# Patient Record
Sex: Male | Born: 1955 | Race: White | Hispanic: No | Marital: Married | Smoking: Former smoker
Health system: Southern US, Community
[De-identification: ages and names within clinical notes are randomized; demographics above are authoritative.]

## PROBLEM LIST (undated history)

## (undated) DIAGNOSIS — G4730 Sleep apnea, unspecified: Secondary | ICD-10-CM

## (undated) DIAGNOSIS — H900 Conductive hearing loss, bilateral: Secondary | ICD-10-CM

## (undated) DIAGNOSIS — E781 Pure hyperglyceridemia: Principal | ICD-10-CM

## (undated) DIAGNOSIS — I10 Essential (primary) hypertension: Secondary | ICD-10-CM

## (undated) DIAGNOSIS — F419 Anxiety disorder, unspecified: Secondary | ICD-10-CM

## (undated) DIAGNOSIS — E559 Vitamin D deficiency, unspecified: Secondary | ICD-10-CM

## (undated) DIAGNOSIS — Z8739 Personal history of other diseases of the musculoskeletal system and connective tissue: Secondary | ICD-10-CM

## (undated) DIAGNOSIS — Z87442 Personal history of urinary calculi: Secondary | ICD-10-CM

## (undated) DIAGNOSIS — I2510 Atherosclerotic heart disease of native coronary artery without angina pectoris: Secondary | ICD-10-CM

## (undated) DIAGNOSIS — M1990 Unspecified osteoarthritis, unspecified site: Secondary | ICD-10-CM

## (undated) DIAGNOSIS — R079 Chest pain, unspecified: Secondary | ICD-10-CM

## (undated) HISTORY — DX: Unspecified osteoarthritis, unspecified site: M19.90

## (undated) HISTORY — PX: COLONOSCOPY: SHX5424

## (undated) HISTORY — PX: CARPAL TUNNEL RELEASE: SHX101

## (undated) HISTORY — DX: Vitamin D deficiency, unspecified: E55.9

## (undated) HISTORY — PX: OTHER SURGICAL HISTORY: SHX169

## (undated) HISTORY — DX: Sleep apnea, unspecified: G47.30

## (undated) HISTORY — DX: Pure hyperglyceridemia: E78.1

## (undated) HISTORY — DX: Personal history of other diseases of the musculoskeletal system and connective tissue: Z87.39

## (undated) HISTORY — DX: Essential (primary) hypertension: I10

## (undated) HISTORY — DX: Chest pain, unspecified: R07.9

## (undated) HISTORY — DX: Conductive hearing loss, bilateral: H90.0

---

## 2016-12-03 DIAGNOSIS — Z8739 Personal history of other diseases of the musculoskeletal system and connective tissue: Secondary | ICD-10-CM

## 2016-12-03 HISTORY — DX: Personal history of other diseases of the musculoskeletal system and connective tissue: Z87.39

## 2017-04-15 ENCOUNTER — Telehealth: Payer: Self-pay

## 2017-04-15 NOTE — Telephone Encounter (Signed)
Pre visit call completed 

## 2017-04-17 ENCOUNTER — Encounter: Payer: Self-pay | Admitting: Family Medicine

## 2017-04-17 ENCOUNTER — Ambulatory Visit (INDEPENDENT_AMBULATORY_CARE_PROVIDER_SITE_OTHER): Payer: BLUE CROSS/BLUE SHIELD | Admitting: Family Medicine

## 2017-04-17 VITALS — BP 104/76 | HR 86 | Temp 98.3°F | Ht 71.5 in | Wt 236.8 lb

## 2017-04-17 DIAGNOSIS — H9191 Unspecified hearing loss, right ear: Secondary | ICD-10-CM

## 2017-04-17 DIAGNOSIS — E785 Hyperlipidemia, unspecified: Secondary | ICD-10-CM

## 2017-04-17 DIAGNOSIS — M1A9XX0 Chronic gout, unspecified, without tophus (tophi): Secondary | ICD-10-CM | POA: Diagnosis not present

## 2017-04-17 DIAGNOSIS — E781 Pure hyperglyceridemia: Secondary | ICD-10-CM

## 2017-04-17 DIAGNOSIS — Z87442 Personal history of urinary calculi: Secondary | ICD-10-CM

## 2017-04-17 DIAGNOSIS — M25521 Pain in right elbow: Secondary | ICD-10-CM

## 2017-04-17 DIAGNOSIS — Z8673 Personal history of transient ischemic attack (TIA), and cerebral infarction without residual deficits: Secondary | ICD-10-CM | POA: Diagnosis not present

## 2017-04-17 HISTORY — DX: Hyperlipidemia, unspecified: E78.5

## 2017-04-17 HISTORY — DX: Pure hyperglyceridemia: E78.1

## 2017-04-17 LAB — COMPREHENSIVE METABOLIC PANEL
ALT: 20 U/L (ref 0–53)
AST: 18 U/L (ref 0–37)
Albumin: 4.4 g/dL (ref 3.5–5.2)
Alkaline Phosphatase: 40 U/L (ref 39–117)
BUN: 25 mg/dL — ABNORMAL HIGH (ref 6–23)
CHLORIDE: 104 meq/L (ref 96–112)
CO2: 26 meq/L (ref 19–32)
Calcium: 9.7 mg/dL (ref 8.4–10.5)
Creatinine, Ser: 1.17 mg/dL (ref 0.40–1.50)
GFR: 67.86 mL/min (ref >60.00)
GLUCOSE: 105 mg/dL — AB (ref 70–99)
POTASSIUM: 4.8 meq/L (ref 3.5–5.1)
Sodium: 139 mEq/L (ref 135–145)
Total Bilirubin: 0.4 mg/dL (ref 0.2–1.2)
Total Protein: 7.1 g/dL (ref 6.0–8.3)

## 2017-04-17 LAB — LIPID PANEL
CHOL/HDL RATIO: 4
Cholesterol: 135 mg/dL (ref 0–200)
HDL: 36.6 mg/dL — ABNORMAL LOW (ref >39.00)
Triglycerides: 441 mg/dL — ABNORMAL HIGH (ref 0.0–149.0)

## 2017-04-17 LAB — LDL CHOLESTEROL, DIRECT: Direct LDL: 52 mg/dL

## 2017-04-17 LAB — URIC ACID: URIC ACID, SERUM: 4.9 mg/dL (ref 4.0–7.8)

## 2017-04-17 NOTE — Patient Instructions (Signed)
If you do not hear anything about your referral in the nexts 1-2 weeks, call our office and ask for an update.  Give us 2-3 business days to get the results of your labs back. If labs are normal, you will likely receive a letter in the mail unless you have MyChart. This can take longer than 2-3 business days.   Let us know if you need anything.

## 2017-04-17 NOTE — Progress Notes (Signed)
Chief Complaint  Patient presents with  . Establish Care       New Patient Visit SUBJECTIVE: HPI: Daniel Waters is an 61 y.o.male who is being seen for establishing care.  Hx of TIA in 2010. On ASA and Lipitor. Tolerating well. Hx of hypertriglyceridemia, well controlled on fenofibrate.   Hx of kidney stones. He used to see a urologist in the area and wishes to re-establish with another one as he cannot find his previous provider. Hx of lithotripsy. No current s/s's.   Jan of this year, he fell of a back extension machine and hit his head. He was found to have an abnormal audiogram and was set to have surgery to repair 3 bones that were displaced in his ear on the R. He was relocated before this could be done. He is still having issues hearing. No pain or drainage from the ear.   Over several decades, he has had R elbow decreased ROM and pain posteriorly. 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 He denies any weakness, numbness, tingling or swelling. He saw an orthopod in [place] before leaving and was going to have a surgery that removes some bone to help with ROM and hopefully pain.   Allergies  Allergen Reactions  . Sulfur     Past Medical History:  Diagnosis Date  . Arthritis   . Asthma   . Hypertriglyceridemia 04/17/2017   Past Surgical History:  Procedure Laterality Date  . CARPAL TUNNEL RELEASE    . knee Bilateral    Social History   Social History  . Marital status: Married   Social History Main Topics  . Smoking status: Never Smoker  . Smokeless tobacco: Never Used  . Alcohol use Yes  . Drug use: No   Family History  Problem Relation Age of Onset  . Cancer Paternal Grandmother   . Stroke Paternal Grandfather      Current Outpatient Prescriptions:  .  allopurinol (ZYLOPRIM) 300 MG tablet, Take 300 mg by mouth., Disp: , Rfl:  .  aspirin 325 MG tablet, Take 325 mg by mouth daily., Disp: , Rfl:  .   atorvastatin (LIPITOR) 40 MG tablet, Take 40 mg by mouth., Disp: , Rfl:  .  Cyanocobalamin (VITAMIN B12 PO), Take by mouth., Disp: , Rfl:  .  fenofibrate (TRICOR) 145 MG tablet, Take 145 mg by mouth., Disp: , Rfl:   ROS Cardiovascular: Denies chest pain  Respiratory: Denies dyspnea   OBJECTIVE: BP 104/76 (BP Location: Left Arm, Patient Position: Sitting, Cuff Size: Normal)   Pulse 86   Temp 98.3 F (36.8 C) (Oral)   Ht 5' 11.5" (1.816 m)   Wt 236 lb 12.8 oz (107.4 kg)   SpO2 95%   BMI 32.57 kg/m   Constitutional: -  VS reviewed -  Well developed, well nourished, appears stated age -  No apparent distress  Psychiatric: -  Oriented to person, place, and time -  Memory intact -  Affect and mood normal -  Fluent conversation, good eye contact -  Judgment and insight age appropriate  Eye: -  Conjunctivae clear, no discharge -  Pupils symmetric, round, reactive to light  ENMT: -  MMM    Pharynx moist, no exudate, no erythema  Neck: -  No gross swelling, no palpable masses -  Thyroid midline, not enlarged, mobile, no palpable masses  Cardiovascular: -  RRR -  No LE edema  Respiratory: -  Normal respiratory effort, no accessory muscle use, no retraction -  Breath sounds equal, no wheezes, no ronchi, no crackles  Neurological:  -  CN II - XII grossly intact -  Sensation grossly intact to light touch, equal bilaterally  Musculoskeletal: -  No clubbing, no cyanosis -  Gait normal -  Very TTP over R posterior elbow, lateral olecranon process; I do not appreciate any deformity -  He is only able to extend his elbow to approx 150 degrees compared to 180 on L.  -  5/5 strength of the upper extremities bilaterally   Skin: -  No significant lesion on inspection -  Warm and dry to palpation   ASSESSMENT/PLAN: Hypertriglyceridemia - Plan: Comprehensive metabolic panel, Lipid panel  History of kidney stones - Plan: Ambulatory referral to Urology  Chronic gout involving toe of right  foot without tophus, unspecified cause - Plan: Uric acid  Hearing loss of right ear, unspecified hearing loss type - Plan: Ambulatory referral to ENT  Right elbow pain - Plan: Ambulatory referral to Orthopedic Surgery  History of TIA (transient ischemic attack)  Patient instructed to sign release of records form from his previous PCP. OK to stop ACEi given good BP. Check uric acid, may be OK to decrease allopurinol.  Patient should return in 6 mo for a med check pending above. The patient voiced understanding and agreement to the plan.   Jilda Rocheicholas Paul [place], DO 04/17/17  10:46 AM

## 2017-04-18 ENCOUNTER — Other Ambulatory Visit: Payer: Self-pay | Admitting: Family Medicine

## 2017-04-18 ENCOUNTER — Encounter: Payer: Self-pay | Admitting: Family Medicine

## 2017-04-18 DIAGNOSIS — M1A9XX1 Chronic gout, unspecified, with tophus (tophi): Secondary | ICD-10-CM

## 2017-04-18 DIAGNOSIS — E781 Pure hyperglyceridemia: Secondary | ICD-10-CM

## 2017-04-18 MED ORDER — ALLOPURINOL 100 MG PO TABS: 100.0000 mg | ORAL_TABLET | Freq: Every day | ORAL | 6 refills | Status: DC

## 2017-04-18 MED ORDER — ICOSAPENT ETHYL 1 G PO CAPS: ORAL_CAPSULE | ORAL | 5 refills | Status: DC

## 2017-04-18 NOTE — Progress Notes (Signed)
Ordered Vascepa for TG's and future lipid panel. Also ordered lower dose of allopurinol and uric acid lab for gout. Pt notified via MyChart. We are to reach out to him to schedule lab visit.

## 2017-05-01 DIAGNOSIS — H900 Conductive hearing loss, bilateral: Secondary | ICD-10-CM | POA: Insufficient documentation

## 2017-05-01 HISTORY — DX: Conductive hearing loss, bilateral: H90.0

## 2017-05-02 ENCOUNTER — Ambulatory Visit (INDEPENDENT_AMBULATORY_CARE_PROVIDER_SITE_OTHER): Payer: BLUE CROSS/BLUE SHIELD

## 2017-05-02 ENCOUNTER — Encounter (INDEPENDENT_AMBULATORY_CARE_PROVIDER_SITE_OTHER): Payer: Self-pay | Admitting: Orthopedic Surgery

## 2017-05-02 ENCOUNTER — Ambulatory Visit (INDEPENDENT_AMBULATORY_CARE_PROVIDER_SITE_OTHER): Payer: BLUE CROSS/BLUE SHIELD | Admitting: Orthopedic Surgery

## 2017-05-02 DIAGNOSIS — M25522 Pain in left elbow: Secondary | ICD-10-CM | POA: Diagnosis not present

## 2017-05-02 MED ORDER — IBUPROFEN-FAMOTIDINE 800-26.6 MG PO TABS: 1.0000 | ORAL_TABLET | Freq: Two times a day (BID) | ORAL | 0 refills | Status: DC | PRN

## 2017-05-02 NOTE — Progress Notes (Signed)
Office Visit Note   Patient: Daniel Waters           Date of Birth: 02-04-1956           MRN: [id number] Visit Date: 05/02/2017 Requested by: Daniel Dory, DO [address] [place], [place] [address] PCP: Daniel Dory, DO  Subjective: Chief Complaint  Patient presents with  . Right Elbow - Pain    HPI: Daniel Waters is a 61 year old right-hand-dominant patient with right elbow pain.  He describes chronic pain.  He wonders if he had an injury from playing many years of baseball.  He is right-hand-dominant.  He recently transferred here from [place].  He is "ready to have his arm fixed".  He is unable to fully extend the arm.  He does from sleep at night with pain and also describes decreased strength in his arm.  He tried over-the-counter medications and injections without relief.  He was seeing a physician in [place] who eventually is going to perform surgery on the elbow.  He does have a history of gout and he is on allopurinol.  He works as an Art gallery manager.              ROS: All systems reviewed are negative as they relate to the chief complaint within the history of present illness.  Patient denies  fevers or chills.   Assessment & Plan: Visit Diagnoses:  1. 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  Plan is observation.  I am get a put him on Duexis.  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  Surgery to address this would be unpredictable at best in terms of relieving any modicum of his pain.  He doesn't have any loose bodies on plain radiographs nor does he have mechanical symptoms.  I think when the time comes he may benefit from capsular release.  Currently he has a flexion contracture of about 25.  I don't think operative indications exist in the right elbow at this time.  It would be difficult to improve his range of motion to an  appreciable degree and to improve his strength with the surgery at this time.  This is a difficult situation for Daniel Waters.  Arthroscopic intervention for any type of spur removal has a potential to worsen his range of motion.  I will see him back when the flexion contracture in his elbow progresses to the point of 35-40 at which time a capsular release could be considered to gain back 20 of motion.  I did explain to him typically that capsular release surgery can restore about half of the lost range of motion.  Follow-Up Instructions: Return if symptoms worsen or fail to improve.   Orders:  Orders Placed This Encounter  Procedures  . XR Elbow Complete Right (3+View)   Meds ordered this encounter  Medications  . Ibuprofen-Famotidine (DUEXIS) 800-26.6 MG TABS    Sig: Take 1 tablet by mouth 2 (two) times daily as needed.    Dispense:  90 tablet    Refill:  0      Procedures: No procedures performed   Clinical Data: No additional findings.  Objective: Vital Signs: There were no vitals taken for this visit.  Physical Exam:   Constitutional: Patient appears well-developed HEENT:  Head: Normocephalic Eyes:EOM are normal Neck: Normal range of motion Cardiovascular: Normal rate  Pulmonary/chest: Effort normal Neurologic: Patient is alert Skin: Skin is warm Psychiatric: Patient has normal mood and affect    Ortho Exam: Right elbow demonstrates range of motion from 25 to full flexion.  Patient has full pronation supination.  There is some coarseness with range of motion.  Fairly symmetric grip strength bilaterally with intact EPL FPL interosseous function.  Negative Tinel's in the cubital tunnel in the elbow.  Specialty Comments:  No specialty comments available.  Imaging: Xr Elbow Complete Right (3+view)  Result Date: 05/02/2017 AP lateral oblique right elbow reviewed.  Severe osteoarthritis is present.  Spurring is noted on the radial head and coronoid process.  Significant  joint space narrowing throughout the ulnohumeral articulation is noted.  No large calcified loose bodies visible in the anterior or posterior elbow compartments    PMFS History: Patient Active Problem List   Diagnosis Date Noted  . Hypertriglyceridemia 04/17/2017   Past Medical History:  Diagnosis Date  . Arthritis   . Asthma   . Hypertriglyceridemia 04/17/2017    Family History  Problem Relation Age of Onset  . Cancer Paternal Grandmother   . Stroke Paternal Grandfather     Past Surgical History:  Procedure Laterality Date  . CARPAL TUNNEL RELEASE    . knee Bilateral    Social History   Occupational History  . Not on file.   Social History Main Topics  . Smoking status: Never Smoker  . Smokeless tobacco: Never Used  . Alcohol use Yes  . Drug use: No  . Sexual activity: Not on file

## 2017-05-14 ENCOUNTER — Telehealth (INDEPENDENT_AMBULATORY_CARE_PROVIDER_SITE_OTHER): Payer: Self-pay | Admitting: Orthopedic Surgery

## 2017-05-14 NOTE — Telephone Encounter (Signed)
IC information provided

## 2017-05-14 NOTE — Telephone Encounter (Signed)
Tresa Endo from [employer] called needing clinicals on the patient's prescription.  CB#[phone].  Thank you.

## 2017-05-15 ENCOUNTER — Telehealth: Payer: Self-pay | Admitting: Family Medicine

## 2017-05-15 NOTE — Telephone Encounter (Signed)
Patient Name: Daniel Waters  DOB: 04/17/1956    Initial Comment Caller states husband having chest pain and some tingling in chest area   Nurse Assessment  Nurse: Stefano Gaul, RN, Dwana Curd Date/Time Lamount Cohen Time): 05/15/2017 2:04:01 PM  Confirm and document reason for call. If symptomatic, describe symptoms. ---Caller states spouse is having intermittent chest pain. BP 150 and is now 130. Says it is tingling. He is at work. She is not sure how long the chest pain lasts.  Does the patient have any new or worsening symptoms? ---Yes  Will a triage be completed? ---No  Select reason for no triage. ---Other  Please document clinical information provided and list any resource used. ---Verified that secondary number is his number. Spouse will text pt that I am calling him so he will answer the phone.    Nurse: Stefano Gaul, RN, Vera Date/Time Lamount Cohen Time): 05/15/2017 2:32:04 PM  Confirm and document reason for call. If symptomatic, describe symptoms. ---Caller states he went home from lunch. Had pain from his left arm to his chest. BP 130 earlier. Pain only last a few seconds. And has happened 2-3 times today. No pain now.   Does the patient have any new or worsening symptoms? ---Yes   Will a triage be completed? ---Yes   Related visit to physician within the last 2 weeks? ---No   Does the PT have any chronic conditions? (i.e. diabetes, asthma, etc.) ---Yes   List chronic conditions. ---TIA; HTN   Is this a behavioral health or substance abuse call? ---No      Guidelines    Guideline Title Affirmed Question Affirmed Notes  Chest Pain Pain also present in shoulder(s) or arm(s) or jaw (Exception: pain is clearly made worse by movement)    Final Disposition User   Go to ED Now Stefano Gaul, RN, Dwana Curd    Comments  attempted to call pt and left message. Will call pt again in a few min  attempted to call spouse back and left message.  pt states he feels fine now and does not want to go to the ER.  Called  office and spoke to [facility] and gave report that pt has had chest pain with left arm 2-3 times today. Pain last a few seconds. No pain now . Triage outcome of go to ER now but pt does not want to go but wants to make appt   Referrals  GO TO FACILITY REFUSED   Caller Disagree/Comply Disagree  Caller Understands Yes  PreDisposition Call Doctor

## 2017-05-16 ENCOUNTER — Ambulatory Visit: Payer: BLUE CROSS/BLUE SHIELD | Admitting: Family Medicine

## 2017-05-16 NOTE — Telephone Encounter (Signed)
It might be a reasonable idea to have him come in to make sure it isn't typical chest pain. If he is strongly against it, make sure he lets [place] know or goes to ED should it return. TY.

## 2017-05-16 NOTE — Telephone Encounter (Signed)
Called the patient and he scheduled an appt in the morning at 7 am/.

## 2017-05-16 NOTE — Telephone Encounter (Signed)
Follow up call made to patient. WIfe states they received call from Triage nurse who stated she would forward information to Dr. Carmelia Roller. Wife states patient is currently not having pain or symptoms at this time. Advised to take patient directly to ED if pains return. Wife agreed.

## 2017-05-17 ENCOUNTER — Encounter: Payer: Self-pay | Admitting: Family Medicine

## 2017-05-17 ENCOUNTER — Ambulatory Visit (INDEPENDENT_AMBULATORY_CARE_PROVIDER_SITE_OTHER): Payer: BLUE CROSS/BLUE SHIELD | Admitting: Family Medicine

## 2017-05-17 VITALS — BP 120/82 | HR 68 | Temp 98.5°F | Ht 73.0 in | Wt 234.2 lb

## 2017-05-17 DIAGNOSIS — R079 Chest pain, unspecified: Secondary | ICD-10-CM

## 2017-05-17 DIAGNOSIS — R0789 Other chest pain: Secondary | ICD-10-CM

## 2017-05-17 NOTE — Progress Notes (Signed)
Pre visit review using our clinic review tool, if applicable. No additional management support is needed unless otherwise documented below in the visit note. 

## 2017-05-17 NOTE — Patient Instructions (Addendum)
If you do not hear anything about your cardiology referral in the next wek call our office and ask for an update.  Continue aspirin for now.  Things I would look out for: Chest pressure that comes with physical exertion, typically centrally located and lasting 10-15 minutes, resolving with rest.

## 2017-05-17 NOTE — Progress Notes (Signed)
Chief Complaint  Patient presents with  . Chest Pain    Daniel Waters is a 61 y.o. male here for evaluation of chest pain.  Duration of issue: 3 days Quality: sharp twitch Palliation: none Provocation: none really- has an active job, will sometimes get twitches after strenuous days Severity: 5/10 Radiation: L arm Duration of chest pain: "split second" Associated symptoms: none Cardiac history: Hypertriglyceridemia Family heart history: Father had MI at 74, no known heart problems with him prior or in other fam members Smoker? No  He is currently taking a full ASA and lipitor daily.   ROS:  Cardiac: No current chest pain Lungs: No SOB  Past Medical History:  Diagnosis Date  . Arthritis   . Asthma   . Hypertriglyceridemia 04/17/2017   Family History  Problem Relation Age of Onset  . Heart attack Father 73       No known heart disease  . Cancer Paternal Grandmother   . Stroke Paternal Grandfather    Social History   Social History  . Marital status: Married   Social History Main Topics  . Smoking status: Never Smoker  . Smokeless tobacco: Never Used  . Alcohol use Yes  . Drug use: No   Allergies as of 05/17/2017      Reactions   Sulfur       Medication List       Accurate as of 05/17/17  7:52 AM. Always use your most recent med list.          allopurinol 100 MG tablet Commonly known as:  ZYLOPRIM Take 1 tablet (100 mg total) by mouth daily.   aspirin 325 MG tablet Take 325 mg by mouth daily.   atorvastatin 40 MG tablet Commonly known as:  LIPITOR Take 40 mg by mouth.   fenofibrate 145 MG tablet Commonly known as:  TRICOR Take 145 mg by mouth.   Ibuprofen-Famotidine 800-26.6 MG Tabs Commonly known as:  DUEXIS Take 1 tablet by mouth 2 (two) times daily as needed.   Icosapent Ethyl 1 g Caps Take 2 tabs twice daily.   VITAMIN B12 PO Take by mouth.      BP 120/82 (BP Location: Left Arm, Patient Position: Sitting, Cuff Size: Large)    Pulse 68   Temp 98.5 F (36.9 C) (Oral)   Ht  (1.854 m)   Wt 234 lb 4 oz (106.3 kg)   SpO2 95%   BMI 30.91 kg/m  Gen: awake, alert, appears stated age HEENT: PERRLA, MMM Neck: No masses or asymmetry Heart: RRR, no bruits, no LE edema Lungs: CTAB, no accessory muscle use Abd: Soft, NT, ND, no masses or organomegaly MSK: chest pain is not reproducible to palptation Psych: Age appropriate judgment and insight, nml mood and affect  Atypical chest pain - Plan: Ambulatory referral to Cardiology  Chest pain, unspecified type - Plan: EKG 12-Lead   EKG shows nonspecific T wave changes. No ST seg elevation/depression. Will refer to cardiology for reassurance and nonspecific EKG. His hx is more suggestive of msk etiology. Informed patient that cardiology team may do stress test or deem that this is certainly not cardiac related.  F/u prn. The patient voiced understanding and agreement to the plan.  Jilda Roche [place], DO 05/17/17 7:52 AM

## 2017-05-20 ENCOUNTER — Encounter: Payer: Self-pay | Admitting: *Deleted

## 2017-05-20 DIAGNOSIS — G4730 Sleep apnea, unspecified: Secondary | ICD-10-CM | POA: Insufficient documentation

## 2017-05-20 DIAGNOSIS — I10 Essential (primary) hypertension: Secondary | ICD-10-CM

## 2017-05-20 DIAGNOSIS — R079 Chest pain, unspecified: Secondary | ICD-10-CM

## 2017-05-20 DIAGNOSIS — E559 Vitamin D deficiency, unspecified: Secondary | ICD-10-CM | POA: Insufficient documentation

## 2017-05-20 HISTORY — DX: Sleep apnea, unspecified: G47.30

## 2017-05-20 HISTORY — DX: Essential (primary) hypertension: I10

## 2017-05-20 HISTORY — DX: Chest pain, unspecified: R07.9

## 2017-05-20 HISTORY — DX: Vitamin D deficiency, unspecified: E55.9

## 2017-05-21 NOTE — Progress Notes (Signed)
Cardiology Office Note:    Date:  05/22/2017   ID:  Daniel Waters, DOB 03/24/1956, MRN [id number]  PCP:  Sharlene Dory, DO  Cardiologist:  Norman Herrlich, MD   Referring MD: Sharlene Dory*  ASSESSMENT:    1. Chest pain in adult   2. Palpitation   3. Hypertriglyceridemia   4. Essential hypertension    PLAN:    In order of problems listed above:  1. His chest pain is quite atypical non anginal but EKG is abnormal and myocardial perfusion study is ordered.if normal I would pursue no further cardiac evaluation 2. At risk for atrial fibrillation with previous TIA symptoms are very infrequent and I asked him to get an Iphone adapter for recording heart rhythm. 3. Stable continue current treatment 4. Stable continue ACE inhibitor   Next appointment 4 weeks   Medication Adjustments/Labs and Tests Ordered: Current medicines are reviewed at length with the patient today.  Concerns regarding medicines are outlined above.  Orders Placed This Encounter  Procedures  . Myocardial Perfusion Imaging   No orders of the defined types were placed in this encounter.    Chief Complaint  Patient presents with  . New Patient (Initial Visit)    Pain in Lt arm 2 times over 2 weeks/ chest pains on and off    History of Present Illness:    Daniel Waters is a 61 y.o. male who is being seen today for the evaluation of chest pain at the request of Sharlene Dory*. He has a history of TIA with aphasiaand 2009 and is taken and has taken ASA since that time.he has intermittent palpitation but no documentation of arrhythmia. Recently he's had a brief momentary twitching in the left arm and left chest nonexertional in nature.he has been under intense work stress with relocation. He's return to his exercise program and has had no exertional chest pains hortness of breath syncope or TIA.he has had no recent palpitation   Past Medical History:  Diagnosis Date  .  Arthritis   . Asthma   . Benign essential HTN 05/20/2017  . Chest pain 05/20/2017  . Conductive hearing loss of both ears 05/01/2017  . History of gout 12/03/2016  . Hypertriglyceridemia 04/17/2017  . Sleep apnea 05/20/2017  . Vitamin D deficiency 05/20/2017    Past Surgical History:  Procedure Laterality Date  . CARPAL TUNNEL RELEASE    . knee Bilateral     Current Medications: Current Meds  Medication Sig  . allopurinol (ZYLOPRIM) 100 MG tablet Take 1 tablet (100 mg total) by mouth daily.  Marland Kitchen aspirin 325 MG tablet Take 325 mg by mouth daily.  Marland Kitchen atorvastatin (LIPITOR) 40 MG tablet Take 40 mg by mouth.  . Cyanocobalamin (VITAMIN B12 PO) Take by mouth.  . DUEXIS 800-26.6 MG TABS Take 1 tablet by mouth 2 (two) times daily as needed.  . fenofibrate (TRICOR) 145 MG tablet Take 145 mg by mouth.  Marland Kitchen lisinopril (PRINIVIL,ZESTRIL) 5 MG tablet Take 5 mg by mouth daily.     Allergies:   Sulfur   Social History   Social History  . Marital status: Married    Spouse name: N/A  . Number of children: N/A  . Years of education: N/A   Social History Main Topics  . Smoking status: Never Smoker  . Smokeless tobacco: Never Used  . Alcohol use Yes  . Drug use: No  . Sexual activity: Not Asked   Other Topics Concern  . None  Social History Narrative  . None     Family History: The patient's family history includes Cancer in his paternal grandmother; Heart attack (age of onset: 31) in his father; Stroke in his paternal grandfather.  ROS:   ROS Please see the history of present illness.     All other systems reviewed and are negative.  EKGs/Labs/Other Studies Reviewed:    The following studies were reviewed today:   EKG 05/17/17: SRTH, diffuse T wave inversion, consider ischemia Recent Labs: 04/17/2017: ALT 20; BUN 25; Creatinine, Ser 1.17; Potassium 4.8; Sodium 139  Recent Lipid Panel    Component Value Date/Time   CHOL 135 04/17/2017 0818   TRIG (H) 04/17/2017 0818     441.0 Triglyceride is over 400; calculations on Lipids are invalid.   HDL 36.60 (L) 04/17/2017 0818   CHOLHDL 4 04/17/2017 0818   LDLDIRECT 52.0 04/17/2017 0818    Physical Exam:    VS:  BP 118/86 (BP Location: Right Arm, Patient Position: Sitting, Cuff Size: Normal)   Pulse 68   Ht  (1.854 m)   Wt 236 lb (107 kg)   SpO2 97%   BMI 31.14 kg/m     Wt Readings from Last 3 Encounters:  05/22/17 236 lb (107 kg)  05/17/17 234 lb 4 oz (106.3 kg)  04/17/17 236 lb 12.8 oz (107.4 kg)     GEN:  Well nourished, well developed in no acute distress HEENT: Normal NECK: No JVD; No carotid bruits LYMPHATICS: No lymphadenopathy CARDIAC: RRR, no murmurs, rubs, gallops RESPIRATORY:  Clear to auscultation without rales, wheezing or rhonchi  ABDOMEN: Soft, non-tender, non-distended MUSCULOSKELETAL:  No edema; No deformity  SKIN: Warm and dry NEUROLOGIC:  Alert and oriented x 3 PSYCHIATRIC:  Normal affect     Signed, Norman Herrlich, MD  05/22/2017 1:22 PM    [facility]

## 2017-05-22 ENCOUNTER — Ambulatory Visit (INDEPENDENT_AMBULATORY_CARE_PROVIDER_SITE_OTHER): Payer: BLUE CROSS/BLUE SHIELD | Admitting: Cardiology

## 2017-05-22 ENCOUNTER — Encounter: Payer: Self-pay | Admitting: Cardiology

## 2017-05-22 VITALS — BP 118/86 | HR 68 | Ht 73.0 in | Wt 236.0 lb

## 2017-05-22 DIAGNOSIS — R079 Chest pain, unspecified: Secondary | ICD-10-CM

## 2017-05-22 DIAGNOSIS — I10 Essential (primary) hypertension: Secondary | ICD-10-CM

## 2017-05-22 DIAGNOSIS — R002 Palpitations: Secondary | ICD-10-CM | POA: Diagnosis not present

## 2017-05-22 DIAGNOSIS — E781 Pure hyperglyceridemia: Secondary | ICD-10-CM

## 2017-05-22 HISTORY — DX: Palpitations: R00.2

## 2017-05-22 NOTE — Patient Instructions (Addendum)
Medication Instructions:  Your physician recommends that you continue on your current medications as directed. Please refer to the Current Medication list given to you today.  Labwork: None  Testing/Procedures: Your physician has requested that you have en exercise stress myoview. For further information please visit [email or web address]. Please follow instruction sheet, as given.  Follow-Up: Your physician recommends that you schedule a follow-up appointment in: 4 weeks.  Any Other Special Instructions Will Be Listed Below (If Applicable).     If you need a refill on your cardiac medications before your next appointment, please call your pharmacy.    KardiaMobile [email or web address]        FDA-cleared, clinical grade mobile EKG monitor: Lourena SimmondsKardia is the most clinically-validated mobile EKG used by the world's leading cardiac care medical professionals With Basic service, know instantly if your heart rhythm is normal or if atrial fibrillation is detected, and email the last single EKG recording to yourself or your doctor Premium service, available for purchase through the Kardia app for $9.99 per month or $99 per year, includes unlimited history and storage of your EKG recordings, a monthly EKG summary report to share with your doctor, along with the ability to track your blood pressure, activity and weight Includes one KardiaMobile phone clip FREE SHIPPING: Standard delivery 1-3 business days. Orders placed by 11:00am PST will ship that afternoon. Otherwise, will ship next business day. All orders ship via [employer] Priority Mail from [place]

## 2017-05-28 ENCOUNTER — Telehealth (HOSPITAL_COMMUNITY): Payer: Self-pay | Admitting: *Deleted

## 2017-05-28 NOTE — Telephone Encounter (Signed)
Patient given detailed instructions per Myocardial Perfusion Study Information Sheet for the test on 05/31/17. Patient notified to arrive 15 minutes early and that it is imperative to arrive on time for appointment to keep from having the test rescheduled.  If you need to cancel or reschedule your appointment, please call the office within 24 hours of your appointment. . Patient verbalized understanding. Cadell Gabrielson Jacqueline    

## 2017-05-31 ENCOUNTER — Ambulatory Visit (HOSPITAL_COMMUNITY): Payer: BLUE CROSS/BLUE SHIELD | Attending: Cardiovascular Disease

## 2017-05-31 DIAGNOSIS — R079 Chest pain, unspecified: Secondary | ICD-10-CM | POA: Diagnosis not present

## 2017-05-31 DIAGNOSIS — R9439 Abnormal result of other cardiovascular function study: Secondary | ICD-10-CM | POA: Diagnosis not present

## 2017-05-31 DIAGNOSIS — I259 Chronic ischemic heart disease, unspecified: Secondary | ICD-10-CM | POA: Diagnosis not present

## 2017-05-31 LAB — MYOCARDIAL PERFUSION IMAGING
CHL CUP MPHR: 162 {beats}/min
CHL CUP NUCLEAR SDS: 4
CHL CUP NUCLEAR SSS: 4
CHL CUP RESTING HR STRESS: 89 {beats}/min
CSEPED: 4 min
CSEPEW: 6.1 METS
CSEPHR: 95 %
Exercise duration (sec): 15 s
LHR: 0.33
LV dias vol: 137 mL (ref 62–150)
LV sys vol: 71 mL
Peak HR: 155 {beats}/min
SRS: 0
TID: 1.25

## 2017-05-31 MED ORDER — TECHNETIUM TC 99M TETROFOSMIN IV KIT
10.5000 | PACK | Freq: Once | INTRAVENOUS | Status: AC | PRN
  Administered 2017-05-31: 10.5 via INTRAVENOUS
  Filled 2017-05-31: qty 11

## 2017-05-31 MED ORDER — TECHNETIUM TC 99M TETROFOSMIN IV KIT
30.8000 | PACK | Freq: Once | INTRAVENOUS | Status: AC | PRN
  Administered 2017-05-31: 30.8 via INTRAVENOUS
  Filled 2017-05-31: qty 31

## 2017-06-04 NOTE — H&P (View-Only) (Signed)
Cardiology Office Note:    Date:  06/05/2017   ID:  Daniel Waters, DOB 02/01/1956, MRN [id number]  PCP:  Daniel Dory, DO  Cardiologist:  Daniel Herrlich, MD    Referring MD: Daniel Waters*    ASSESSMENT:    1. Abnormal myocardial perfusion study   2. Abnormal exercise myocardial perfusion study    PLAN:    In order of problems listed above:  1. His myocardial perfusion study shows a limited perfusion defect but a markedly abnormal EKG prompting discontinuation of his stress test and abnormal blood pressure response and reduced ejection fraction.  He will continue medical treatment including aspirin statin initiate beta-blocker and undergo further evaluation.  His apprehension is particularly acute regarding coronary angiography cardiac CTA with flow reserve will be employed.  He has high risk markers engaged him in a discussion vascularization.  At present he is having no anginal discomfort.  After further discussion of option benefit and risk he has decided to proceed with left heart catheterization.  Today 06/19/2017 Dr. Myna Waters came to my office and told me is in opinion there is no underlying hematologic problem and strongly encouraged me to proceed with needed coronary angiography.  His consultation is in the chart the patient is agreeable understands options risk and benefits and will be scheduled in the next 2 weeks to undergo coronary angiography.  Next appointment: 4 weeks   Medication Adjustments/Labs and Tests Ordered: Current medicines are reviewed at length with the patient today.  Concerns regarding medicines are outlined above.  Orders Placed This Encounter  Procedures  . CT CORONARY MORPH W/CTA COR W/SCORE W/CA W/CM &/OR WO/CM  . CT CORONARY FRACTIONAL FLOW RESERVE DATA PREP  . CT CORONARY FRACTIONAL FLOW RESERVE FLUID ANALYSIS  . CT CORONARY FRACTIONAL FLOW RESERVE FLUID ANALYSIS   Meds ordered this encounter  Medications  . metoprolol  tartrate (LOPRESSOR) 25 MG tablet    Sig: Take 1 tablet (25 mg total) by mouth 2 (two) times daily.    Dispense:  60 tablet    Refill:  3  . nitroGLYCERIN (NITROSTAT) 0.4 MG SL tablet    Sig: Place 1 tablet (0.4 mg total) under the tongue every 5 (five) minutes as needed for chest pain.    Dispense:  25 tablet    Refill:  12    Chief Complaint  Patient presents with  . Follow-up    Testing    History of Present Illness:    Daniel Waters is a 61 y.o. male with a hx of chest pain last seen 4 weeks ago.  He is quite emotionally distressed with an abnormal myocardial perfusion study.  He is not having angina is exercising 30 minutes a day.  I did ask him to start a beta-blocker gave him a prescription for nitroglycerin and advised further evaluation.  After a long discussion with the assistance of his wife she prefers cardiac CTA with fractional flow reserve over invasive coronary arteriography to assess the severity of his CAD guide treatment beyond medication.. Compliance with diet, lifestyle and medications: Yes Past Medical History:  Diagnosis Date  . Arthritis   . Asthma   . Benign essential HTN 05/20/2017  . Chest pain 05/20/2017  . Conductive hearing loss of both ears 05/01/2017  . History of gout 12/03/2016  . Hypertriglyceridemia 04/17/2017  . Sleep apnea 05/20/2017  . Vitamin D deficiency 05/20/2017    Past Surgical History:  Procedure Laterality Date  . CARPAL TUNNEL RELEASE    .  knee Bilateral     Current Medications: Current Meds  Medication Sig  . allopurinol (ZYLOPRIM) 100 MG tablet Take 1 tablet (100 mg total) by mouth daily.  Marland Kitchen. aspirin 325 MG tablet Take 325 mg by mouth daily.  Marland Kitchen. atorvastatin (LIPITOR) 40 MG tablet Take 40 mg by mouth.  . Cyanocobalamin (VITAMIN B12 PO) Take by mouth.  . DUEXIS 800-26.6 MG TABS Take 1 tablet by mouth 2 (two) times daily as needed.  . fenofibrate (TRICOR) 145 MG tablet Take 145 mg by mouth.  Marland Kitchen. lisinopril (PRINIVIL,ZESTRIL)  5 MG tablet Take 5 mg by mouth daily.     Allergies:   Sulfur   Social History   Social History  . Marital status: Married    Spouse name: N/A  . Number of children: N/A  . Years of education: N/A   Social History Main Topics  . Smoking status: Never Smoker  . Smokeless tobacco: Never Used  . Alcohol use Yes  . Drug use: No  . Sexual activity: Not Asked   Other Topics Concern  . None   Social History Narrative  . None     Family History: The patient's family history includes Cancer in his paternal grandmother; Heart attack (age of onset: 2973) in his father; Stroke in his paternal grandfather. ROS:   Please see the history of present illness.    All other systems reviewed and are negative.  EKGs/Labs/Other Studies Reviewed:    The following studies were reviewed today MPI: Study Highlights     Nuclear stress EF: 48%. No significant wall motion abnormalities noted  Horizontal ST segment depression ST segment depression of 2 mm was noted during stress in the II, III, aVF, V5 and V6 leads.  Defect 1: There is a medium defect of moderate severity present in the apical lateral location.  Findings consistent with ischemia.  This is a high risk exercise treadmill test secondary to markedly abnormal ST segment depression during exercise and intermediate risk perfusion study demonstrating apical lateral ischemia.  6 Mets, blunted BP response Stress Findings The patient exercised following the Bruce protocol.  The patient reported no symptoms during the stress test.   The test was stopped because the technologist observed ECG changes.        Daniel SchultzMark Skains, MD    Recent Labs: 04/17/2017: ALT 20; BUN 25; Creatinine, Ser 1.17; Potassium 4.8; Sodium 139  Recent Lipid Panel    Component Value Date/Time   CHOL 135 04/17/2017 0818   TRIG (H) 04/17/2017 0818    441.0 Triglyceride is over 400; calculations on Lipids are invalid.   HDL 36.60 (L) 04/17/2017 0818    CHOLHDL 4 04/17/2017 0818   LDLDIRECT 52.0 04/17/2017 0818    Physical Exam:    VS:  BP 130/78 (BP Location: Right Arm, Patient Position: Sitting, Cuff Size: Normal)   Pulse 92   Ht 6\' 1"  (1.854 m)   Wt 232 lb (105.2 kg)   SpO2 98%   BMI 30.61 kg/m     Wt Readings from Last 3 Encounters:  06/05/17 232 lb (105.2 kg)  05/22/17 236 lb (107 kg)  05/17/17 234 lb 4 oz (106.3 kg)     GEN: He is very anxious today well nourished, well developed in no acute distress HEENT: Normal NECK: No JVD; No carotid bruits LYMPHATICS: No lymphadenopathy CARDIAC: RRR, no murmurs, rubs, gallops RESPIRATORY:  Clear to auscultation without rales, wheezing or rhonchi  ABDOMEN: Soft, non-tender, non-distended MUSCULOSKELETAL:  No edema; No  deformity  SKIN: Warm and dry NEUROLOGIC:  Alert and oriented x 3 PSYCHIATRIC:  Normal affect    Signed, Daniel Herrlich, MD  06/05/2017 1:19 PM    [facility]

## 2017-06-04 NOTE — Progress Notes (Addendum)
Cardiology Office Note:    Date:  06/05/2017   ID:  Daniel Waters, DOB 02/01/1956, MRN [id number]  PCP:  Sharlene Dory, DO  Cardiologist:  Norman Herrlich, MD    Referring MD: Sharlene Dory*    ASSESSMENT:    1. Abnormal myocardial perfusion study   2. Abnormal exercise myocardial perfusion study    PLAN:    In order of problems listed above:  1. His myocardial perfusion study shows a limited perfusion defect but a markedly abnormal EKG prompting discontinuation of his stress test and abnormal blood pressure response and reduced ejection fraction.  He will continue medical treatment including aspirin statin initiate beta-blocker and undergo further evaluation.  His apprehension is particularly acute regarding coronary angiography cardiac CTA with flow reserve will be employed.  He has high risk markers engaged him in a discussion vascularization.  At present he is having no anginal discomfort.  After further discussion of option benefit and risk he has decided to proceed with left heart catheterization.  Today 06/19/2017 Dr. Myna Hidalgo came to my office and told me is in opinion there is no underlying hematologic problem and strongly encouraged me to proceed with needed coronary angiography.  His consultation is in the chart the patient is agreeable understands options risk and benefits and will be scheduled in the next 2 weeks to undergo coronary angiography.  Next appointment: 4 weeks   Medication Adjustments/Labs and Tests Ordered: Current medicines are reviewed at length with the patient today.  Concerns regarding medicines are outlined above.  Orders Placed This Encounter  Procedures  . CT CORONARY MORPH W/CTA COR W/SCORE W/CA W/CM &/OR WO/CM  . CT CORONARY FRACTIONAL FLOW RESERVE DATA PREP  . CT CORONARY FRACTIONAL FLOW RESERVE FLUID ANALYSIS  . CT CORONARY FRACTIONAL FLOW RESERVE FLUID ANALYSIS   Meds ordered this encounter  Medications  . metoprolol  tartrate (LOPRESSOR) 25 MG tablet    Sig: Take 1 tablet (25 mg total) by mouth 2 (two) times daily.    Dispense:  60 tablet    Refill:  3  . nitroGLYCERIN (NITROSTAT) 0.4 MG SL tablet    Sig: Place 1 tablet (0.4 mg total) under the tongue every 5 (five) minutes as needed for chest pain.    Dispense:  25 tablet    Refill:  12    Chief Complaint  Patient presents with  . Follow-up    Testing    History of Present Illness:    Daniel Waters is a 61 y.o. male with a hx of chest pain last seen 4 weeks ago.  He is quite emotionally distressed with an abnormal myocardial perfusion study.  He is not having angina is exercising 30 minutes a day.  I did ask him to start a beta-blocker gave him a prescription for nitroglycerin and advised further evaluation.  After a long discussion with the assistance of his wife she prefers cardiac CTA with fractional flow reserve over invasive coronary arteriography to assess the severity of his CAD guide treatment beyond medication.. Compliance with diet, lifestyle and medications: Yes Past Medical History:  Diagnosis Date  . Arthritis   . Asthma   . Benign essential HTN 05/20/2017  . Chest pain 05/20/2017  . Conductive hearing loss of both ears 05/01/2017  . History of gout 12/03/2016  . Hypertriglyceridemia 04/17/2017  . Sleep apnea 05/20/2017  . Vitamin D deficiency 05/20/2017    Past Surgical History:  Procedure Laterality Date  . CARPAL TUNNEL RELEASE    .  knee Bilateral     Current Medications: Current Meds  Medication Sig  . allopurinol (ZYLOPRIM) 100 MG tablet Take 1 tablet (100 mg total) by mouth daily.  Marland Kitchen. aspirin 325 MG tablet Take 325 mg by mouth daily.  Marland Kitchen. atorvastatin (LIPITOR) 40 MG tablet Take 40 mg by mouth.  . Cyanocobalamin (VITAMIN B12 PO) Take by mouth.  . DUEXIS 800-26.6 MG TABS Take 1 tablet by mouth 2 (two) times daily as needed.  . fenofibrate (TRICOR) 145 MG tablet Take 145 mg by mouth.  Marland Kitchen. lisinopril (PRINIVIL,ZESTRIL)  5 MG tablet Take 5 mg by mouth daily.     Allergies:   Sulfur   Social History   Social History  . Marital status: Married    Spouse name: N/A  . Number of children: N/A  . Years of education: N/A   Social History Main Topics  . Smoking status: Never Smoker  . Smokeless tobacco: Never Used  . Alcohol use Yes  . Drug use: No  . Sexual activity: Not Asked   Other Topics Concern  . None   Social History Narrative  . None     Family History: The patient's family history includes Cancer in his paternal grandmother; Heart attack (age of onset: 2973) in his father; Stroke in his paternal grandfather. ROS:   Please see the history of present illness.    All other systems reviewed and are negative.  EKGs/Labs/Other Studies Reviewed:    The following studies were reviewed today MPI: Study Highlights     Nuclear stress EF: 48%. No significant wall motion abnormalities noted  Horizontal ST segment depression ST segment depression of 2 mm was noted during stress in the II, III, aVF, V5 and V6 leads.  Defect 1: There is a medium defect of moderate severity present in the apical lateral location.  Findings consistent with ischemia.  This is a high risk exercise treadmill test secondary to markedly abnormal ST segment depression during exercise and intermediate risk perfusion study demonstrating apical lateral ischemia.  6 Mets, blunted BP response Stress Findings The patient exercised following the Bruce protocol.  The patient reported no symptoms during the stress test.   The test was stopped because the technologist observed ECG changes.        Donato SchultzMark Skains, MD    Recent Labs: 04/17/2017: ALT 20; BUN 25; Creatinine, Ser 1.17; Potassium 4.8; Sodium 139  Recent Lipid Panel    Component Value Date/Time   CHOL 135 04/17/2017 0818   TRIG (H) 04/17/2017 0818    441.0 Triglyceride is over 400; calculations on Lipids are invalid.   HDL 36.60 (L) 04/17/2017 0818    CHOLHDL 4 04/17/2017 0818   LDLDIRECT 52.0 04/17/2017 0818    Physical Exam:    VS:  BP 130/78 (BP Location: Right Arm, Patient Position: Sitting, Cuff Size: Normal)   Pulse 92   Ht 6\' 1"  (1.854 m)   Wt 232 lb (105.2 kg)   SpO2 98%   BMI 30.61 kg/m     Wt Readings from Last 3 Encounters:  06/05/17 232 lb (105.2 kg)  05/22/17 236 lb (107 kg)  05/17/17 234 lb 4 oz (106.3 kg)     GEN: He is very anxious today well nourished, well developed in no acute distress HEENT: Normal NECK: No JVD; No carotid bruits LYMPHATICS: No lymphadenopathy CARDIAC: RRR, no murmurs, rubs, gallops RESPIRATORY:  Clear to auscultation without rales, wheezing or rhonchi  ABDOMEN: Soft, non-tender, non-distended MUSCULOSKELETAL:  No edema; No  deformity  SKIN: Warm and dry NEUROLOGIC:  Alert and oriented x 3 PSYCHIATRIC:  Normal affect    Signed, Norman Herrlich, MD  06/05/2017 1:19 PM    [facility]

## 2017-06-05 ENCOUNTER — Encounter: Payer: Self-pay | Admitting: Cardiology

## 2017-06-05 ENCOUNTER — Ambulatory Visit (INDEPENDENT_AMBULATORY_CARE_PROVIDER_SITE_OTHER): Payer: BLUE CROSS/BLUE SHIELD | Admitting: Cardiology

## 2017-06-05 ENCOUNTER — Other Ambulatory Visit: Payer: Self-pay

## 2017-06-05 VITALS — BP 130/78 | HR 92 | Ht 73.0 in | Wt 232.0 lb

## 2017-06-05 DIAGNOSIS — R9439 Abnormal result of other cardiovascular function study: Secondary | ICD-10-CM

## 2017-06-05 MED ORDER — NITROGLYCERIN 0.4 MG SL SUBL: 0.4000 mg | SUBLINGUAL_TABLET | SUBLINGUAL | 12 refills | Status: DC | PRN

## 2017-06-05 MED ORDER — METOPROLOL TARTRATE 25 MG PO TABS: 25.0000 mg | ORAL_TABLET | Freq: Two times a day (BID) | ORAL | 3 refills | Status: DC

## 2017-06-05 NOTE — Patient Instructions (Signed)
Medication Instructions:  Your physician has recommended you make the following change in your medication:  START metoprolol (Lopressor) 25 mg twice daily START nitroglycerin 0.4 mg sublingual (under your tongue) as needed for chest pain. When having chest pain, stop what you are doing and sit down. Take 1 nitro, wait 5 minutes. Still having chest pain, take 1 nitro, wait 5 minutes. Still having chest pain, take 1 nitro, dial 911. Total of 3 nitro in 15 minutes.  Labwork: None  Testing/Procedures: Your physician has requested that you have cardiac CT. Cardiac computed tomography (CT) is a painless test that uses an x-ray machine to take clear, detailed pictures of your heart. For further information please visit [email or web address]. Please follow instruction sheet as given.  Follow-Up: Your physician recommends that you schedule a follow-up appointment in: 4 weeks.  Any Other Special Instructions Will Be Listed Below (If Applicable).     If you need a refill on your cardiac medications before your next appointment, please call your pharmacy.  [facility] at xx:xx AM (30-45 minutes prior to test start time)  [facility] [address] [place], [place] [address] [phone]  Proceed to the [facility] Radiology Department (First Floor).  Please follow these instructions carefully (unless otherwise directed):  Hold all erectile dysfunction medications at least 48 hours prior to test.  On the Night Before the Test: . Drink plenty of water. . Do not consume any caffeinated/decaffeinated beverages or chocolate 12 hours prior to your test. . Do not take any antihistamines 12 hours prior to your test. . If you take Metformin do not take 24 hours prior to test. . If the patient has contrast allergy: ? Patient will need a prescription for Prednisone and very clear instructions (as follows): 1. Prednisone 50 mg -  take 13 hours prior to test 2. Take another Prednisone 50 mg 7 hours prior to test 3. Take another Prednisone 50 mg 1 hour prior to test 4. Take Benadryl 50 mg 1 hour prior to test . Patient must complete all four doses of above prophylactic medications. . Patient will need a ride after test due to Benadryl.  On the Day of the Test: . Drink plenty of water. Do not drink any water within one hour of the test. . Do not eat any food 4 hours prior to the test. . You may take your regular medications prior to the test. . IF NOT ON A BETA BLOCKER - Take 50 mg of lopressor (metoprolol) one hour before the test. . HOLD Furosemide morning of the test.  After the Test: . Drink plenty of water. . After receiving IV contrast, you may experience a mild flushed feeling. This is normal. . On occasion, you may experience a mild rash up to 24 hours after the test. This is not dangerous. If this occurs, you can take Benadryl 25 mg and increase your fluid intake. . If you experience trouble breathing, this can be serious. If it is severe call 911 IMMEDIATELY. If it is mild, please call our office. . If you take any of these medications: Glipizide/Metformin, Avandament, Glucavance, please do not take 48 hours after completing test.

## 2017-06-05 NOTE — Addendum Note (Signed)
Addended by: Norman HerrlichMUNLEY, Yechezkel Fertig on: 06/05/2017 04:16 PM   Modules accepted: Orders, SmartSet

## 2017-06-06 ENCOUNTER — Ambulatory Visit (INDEPENDENT_AMBULATORY_CARE_PROVIDER_SITE_OTHER): Payer: BLUE CROSS/BLUE SHIELD | Admitting: Cardiology

## 2017-06-06 ENCOUNTER — Ambulatory Visit (HOSPITAL_BASED_OUTPATIENT_CLINIC_OR_DEPARTMENT_OTHER)
Admission: RE | Admit: 2017-06-06 | Discharge: 2017-06-06 | Disposition: A | Discharge status: Home / Self Care | Payer: BLUE CROSS/BLUE SHIELD | Source: Ambulatory Visit | Attending: Cardiology | Admitting: Cardiology

## 2017-06-06 DIAGNOSIS — R9439 Abnormal result of other cardiovascular function study: Secondary | ICD-10-CM | POA: Diagnosis not present

## 2017-06-06 NOTE — Patient Instructions (Signed)
   [facility] [address], [address] [place] [address] Dept: [phone] Loc: [phone]  Debby BudDonald Hopping  06/06/2017  You are scheduled for a Cardiac Catheterization on Monday, November 5 with Dr. Bryan Lemmaavid Harding.  1. Please arrive at the [facility] ([facility] A[facility] at [facility]: [address] [place], [place] [address] at 5:30 AM (two hours before your procedure to ensure your preparation). Free valet parking service is available.   Special note: Every effort is made to have your procedure done on time. Please understand that emergencies sometimes delay scheduled procedures.  2. Diet: Do not eat or drink anything after midnight prior to your procedure except sips of water to take medications.  3. Labs: None needed. Having today.  4. Medication instructions in preparation for your procedure:  On the morning of your procedure, take your Aspirin and any morning medicines NOT listed above.  You may use sips of water.  5. Plan for one night stay--bring personal belongings. 6. Bring a current list of your medications and current insurance cards. 7. You MUST have a responsible person to drive you home. 8. Someone MUST be with you the first 24 hours after you arrive home or your discharge will be delayed. 9. Please wear clothes that are easy to get on and off and wear slip-on shoes.  Thank you for allowing us to care for you!   -- [facility]

## 2017-06-06 NOTE — Progress Notes (Signed)
At office visit yesterday with Dr. Dulce SellarMunley, patient opted for cardiac CT versus cardiac cath. Patient thought about it yesterday afternoon after appointment and changed his mind for cardiac cath instead of cardiac CT. Reviewed with Dr. Dulce SellarMunley and Dr. Dulce SellarMunley ordered cardiac cath. Patient and wife arrived today for instructions of cardiac cath. Patient is extremely nervous about the procedure. Spent about 45 minutes with patient and wife answering questions and explaining instructions and how the procedure works. Patient and wife's questions answered to satisfaction. Advised patient and wife they could call with any further questions or concerns they may think of.

## 2017-06-07 ENCOUNTER — Telehealth: Payer: Self-pay

## 2017-06-07 NOTE — Telephone Encounter (Signed)
Call back received from wife.  Addl concerns addressed.  Wife and husband concerned d/t Pt with severe anxiety.  Sent note to interventionalist to make him aware.

## 2017-06-07 NOTE — Telephone Encounter (Signed)
Left detailed message per DPR  Patient contacted [facility] at [facility] scheduled for:  06/10/2017 @ 0730 Verified arrival time and place:  NT @ 0530 Confirmed AM meds to be taken pre-cath with sip of water: Take ASA Notified must have responsible driver and someone to be with Pt 24 hours after procedure Left this nurse name and # for call back if any questions.

## 2017-06-08 LAB — COMPREHENSIVE METABOLIC PANEL
A/G RATIO: 2.1 (ref 1.2–2.2)
ALT: 22 IU/L (ref 0–44)
AST: 21 IU/L (ref 0–40)
Albumin: 5 g/dL (ref 3.5–5.5)
Alkaline Phosphatase: 50 IU/L (ref 39–117)
BUN/Creatinine Ratio: 15 (ref 9–20)
BUN: 17 mg/dL (ref 6–24)
Bilirubin Total: 0.3 mg/dL (ref 0.0–1.2)
CALCIUM: 9.8 mg/dL (ref 8.7–10.2)
CO2: 22 mmol/L (ref 20–29)
Chloride: 104 mmol/L (ref 96–106)
Creatinine, Ser: 1.13 mg/dL (ref 0.76–1.27)
GFR, EST AFRICAN AMERICAN: 82 mL/min/{1.73_m2} (ref >59)
GFR, EST NON AFRICAN AMERICAN: 71 mL/min/{1.73_m2} (ref >59)
Globulin, Total: 2.4 g/dL (ref 1.5–4.5)
Glucose: 87 mg/dL (ref 65–99)
POTASSIUM: 4.6 mmol/L (ref 3.5–5.2)
Sodium: 144 mmol/L (ref 134–144)
TOTAL PROTEIN: 7.4 g/dL (ref 6.0–8.5)

## 2017-06-08 LAB — PROTIME-INR
INR: 1 (ref 0.8–1.2)
Prothrombin Time: 10.2 s (ref 9.1–12.0)

## 2017-06-08 LAB — IMMATURE CELLS: OTHER, LINEAGE UNCERTAIN: 8 % — AB (ref 0–0)

## 2017-06-10 ENCOUNTER — Ambulatory Visit (HOSPITAL_COMMUNITY): Admission: RE | Admit: 2017-06-10 | Payer: BLUE CROSS/BLUE SHIELD | Source: Ambulatory Visit | Admitting: Cardiology

## 2017-06-10 ENCOUNTER — Telehealth: Payer: Self-pay

## 2017-06-10 ENCOUNTER — Encounter (HOSPITAL_COMMUNITY): Admission: RE | Payer: Self-pay | Source: Ambulatory Visit

## 2017-06-10 DIAGNOSIS — D729 Disorder of white blood cells, unspecified: Secondary | ICD-10-CM

## 2017-06-10 SURGERY — LEFT HEART CATH AND CORONARY ANGIOGRAPHY
Anesthesia: LOCAL

## 2017-06-10 NOTE — Telephone Encounter (Signed)
-----   Message from Baldo DaubBrian J Munley, MD sent at 06/08/2017  1:41 PM EDT ----- Abnormal WBC ? Lymphoma. [facility] and [facility] to [facility] and cancelled Cath. Can you work to get him into hematology ASAP. I discussed with his wife. TY

## 2017-06-11 ENCOUNTER — Telehealth: Payer: Self-pay | Admitting: Cardiology

## 2017-06-11 MED ORDER — LORAZEPAM 1 MG PO TABS: 1.0000 mg | ORAL_TABLET | ORAL | 0 refills | Status: DC | PRN

## 2017-06-11 NOTE — Telephone Encounter (Signed)
Please advise 

## 2017-06-11 NOTE — Telephone Encounter (Signed)
Wants Dr Dulce SellarMunley to prescribe him something for anxiety so he can sleep

## 2017-06-11 NOTE — Telephone Encounter (Signed)
Advised wife, Daniel Waters, that per verbal order from Dr. Dulce SellarMunley, a presciption for Ativan 1 mg once daily as needed for anxiety will be written. Advised that only 15 tablets will be given with no refills. Daniel Nationsdvised [name] that she will need to come pick up the prescription as it is a controlled substance. Daniel Waters verbalized understanding of medication instructions. Also, advised Daniel Waters that I spoke with the cancer center and the hematology referral is under medical review and an appointment will be scheduled as soon as possible pending review. Daniel Waters verbalized understanding.

## 2017-06-18 ENCOUNTER — Ambulatory Visit (HOSPITAL_BASED_OUTPATIENT_CLINIC_OR_DEPARTMENT_OTHER): Payer: BLUE CROSS/BLUE SHIELD | Admitting: Hematology & Oncology

## 2017-06-18 ENCOUNTER — Other Ambulatory Visit (HOSPITAL_COMMUNITY)
Admission: RE | Admit: 2017-06-18 | Discharge: 2017-06-18 | Disposition: A | Discharge status: Home / Self Care | Payer: BLUE CROSS/BLUE SHIELD | Source: Ambulatory Visit | Attending: Hematology & Oncology | Admitting: Hematology & Oncology

## 2017-06-18 ENCOUNTER — Other Ambulatory Visit: Payer: Self-pay

## 2017-06-18 ENCOUNTER — Other Ambulatory Visit: Payer: Self-pay | Admitting: Cardiology

## 2017-06-18 ENCOUNTER — Telehealth: Payer: Self-pay

## 2017-06-18 ENCOUNTER — Encounter: Payer: Self-pay | Admitting: Hematology & Oncology

## 2017-06-18 ENCOUNTER — Other Ambulatory Visit (HOSPITAL_BASED_OUTPATIENT_CLINIC_OR_DEPARTMENT_OTHER): Payer: BLUE CROSS/BLUE SHIELD

## 2017-06-18 VITALS — BP 134/85 | HR 72 | Temp 97.8°F | Resp 16 | Wt 232.0 lb

## 2017-06-18 DIAGNOSIS — R9439 Abnormal result of other cardiovascular function study: Secondary | ICD-10-CM

## 2017-06-18 LAB — CBC WITH DIFFERENTIAL (CANCER CENTER ONLY)
BASO#: 0 10*3/uL (ref 0.0–0.2)
BASO%: 0.2 % (ref 0.0–2.0)
EOS%: 1 % (ref 0.0–7.0)
Eosinophils Absolute: 0.1 10*3/uL (ref 0.0–0.5)
HEMATOCRIT: 39 % (ref 38.7–49.9)
HGB: 12.7 g/dL — ABNORMAL LOW (ref 13.0–17.1)
LYMPH#: 2.1 10*3/uL (ref 0.9–3.3)
LYMPH%: 39.9 % (ref 14.0–48.0)
MCH: 32.7 pg (ref 28.0–33.4)
MCHC: 32.6 g/dL (ref 32.0–35.9)
MCV: 101 fL — AB (ref 82–98)
MONO#: 0.7 10*3/uL (ref 0.1–0.9)
MONO%: 12.8 % (ref 0.0–13.0)
NEUT#: 2.4 10*3/uL (ref 1.5–6.5)
NEUT%: 46.1 % (ref 40.0–80.0)
PLATELETS: 209 10*3/uL (ref 145–400)
RBC: 3.88 10*6/uL — ABNORMAL LOW (ref 4.20–5.70)
RDW: 12.4 % (ref 11.1–15.7)
WBC: 5.2 10*3/uL (ref 4.0–10.0)

## 2017-06-18 LAB — CHCC SATELLITE - SMEAR

## 2017-06-18 LAB — LACTATE DEHYDROGENASE: LDH: 166 U/L (ref 125–245)

## 2017-06-18 NOTE — Progress Notes (Signed)
Is Referral MD  Reason for Referral: Abnormal white blood cells  Chief Complaint  Patient presents with  . New Patient (Initial Visit)  : I was told that I had blood cancer.  HPI: Daniel Waters is a very nice 61 year old white male.  He comes in with his wife.  He is an Art gallery manager.  He works for a [employer] and goes to their factories to make sure that everything is running smoothly.  The interesting thing is that he went to college with 1 of my best friends from high school.  He sees Dr. Dulce Sellar of cardiology.  Dr. Dulce Sellar did a stress test.  The stress test showed that Daniel Waters had a low ejection fraction.  He had no symptoms.  Dr. Vella Redhead felt that a cardiac cath was then indicated.  I am not sure exactly what happened next but he had some lab work done.  Somehow, a Sports administrator at the company said that he had abnormal lymphocytes that suggested lymphoma in his blood.  Because of this, Dr. Dulce Sellar canceled the cardiac cath.  Daniel Waters has had no weight loss or weight gain.  He has had no palpable lymph glands.  He has had no fever or sweats.  He has had no rashes.  He has had past knee surgery.  He has had a colonoscopy 5 years ago.  He smoked many years ago.  He does not drink.  There is no family history of blood cancer.  He has had his grandmother had cancer.  He has had no cough or shortness of breath.  He says he does the elliptical daily for 1/2-hour.  Overall, I would say his performance status is ECOG 0.   Past Medical History:  Diagnosis Date  . Arthritis   . Asthma   . Benign essential HTN 05/20/2017  . Chest pain 05/20/2017  . Conductive hearing loss of both ears 05/01/2017  . History of gout 12/03/2016  . Hypertriglyceridemia 04/17/2017  . Sleep apnea 05/20/2017  . Vitamin D deficiency 05/20/2017  :  Past Surgical History:  Procedure Laterality Date  . CARPAL TUNNEL RELEASE    . knee Bilateral   :   Current Outpatient Medications:  .   acetaminophen (TYLENOL) 500 MG tablet, Take 500 mg by mouth daily as needed for moderate pain., Disp: , Rfl:  .  allopurinol (ZYLOPRIM) 100 MG tablet, Take 1 tablet (100 mg total) by mouth daily., Disp: 30 tablet, Rfl: 6 .  aspirin 325 MG tablet, Take 325 mg by mouth daily., Disp: , Rfl:  .  atorvastatin (LIPITOR) 40 MG tablet, Take 40 mg by mouth., Disp: , Rfl:  .  Cyanocobalamin (B-12) 2500 MCG TABS, Take 2,500 mcg by mouth daily., Disp: , Rfl:  .  diphenhydramine-acetaminophen (TYLENOL PM) 25-500 MG TABS tablet, Take 1 tablet by mouth at bedtime as needed (sleep)., Disp: , Rfl:  .  DUEXIS 800-26.6 MG TABS, Take 1 tablet by mouth daily as needed (pain). , Disp: , Rfl: 0 .  fenofibrate (TRICOR) 145 MG tablet, Take 145 mg by mouth., Disp: , Rfl:  .  Krill Oil 350 MG CAPS, Take 350 mg by mouth daily., Disp: , Rfl:  .  lisinopril (PRINIVIL,ZESTRIL) 5 MG tablet, Take 5 mg by mouth daily., Disp: , Rfl: 2 .  LORazepam (ATIVAN) 1 MG tablet, Take 1 tablet (1 mg total) as needed by mouth for anxiety. Take 1 tablet by mouth daily as needed for anxiety., Disp: 15 tablet, Rfl: 0 .  metoprolol tartrate (LOPRESSOR) 25 MG tablet, Take 1 tablet (25 mg total) by mouth 2 (two) times daily., Disp: 60 tablet, Rfl: 3 .  nitroGLYCERIN (NITROSTAT) 0.4 MG SL tablet, Place 1 tablet (0.4 mg total) under the tongue every 5 (five) minutes as needed for chest pain., Disp: 25 tablet, Rfl: 12:  :  Allergies  Allergen Reactions  . Sulfur Nausea And Vomiting and Rash  :  Family History  Problem Relation Age of Onset  . Heart attack Father [id number]       No known heart disease  . Cancer Paternal Grandmother   . Stroke Paternal Grandfather   :  Social History   Socioeconomic History  . Marital status: Married    Spouse name: Not on file  . Number of children: Not on file  . Years of education: Not on file  . Highest education level: Not on file  Social Needs  . Financial resource strain: Not on file  . Food  insecurity - worry: Not on file  . Food insecurity - inability: Not on file  . Transportation needs - medical: Not on file  . Transportation needs - non-medical: Not on file  Occupational History  . Not on file  Tobacco Use  . Smoking status: Never Smoker  . Smokeless tobacco: Never Used  Substance and Sexual Activity  . Alcohol use: Yes  . Drug use: No  . Sexual activity: Not on file  Other Topics Concern  . Not on file  Social History Narrative  . Not on file  :  Pertinent items are noted in HPI.  Exam: Well-developed and well-nourished white male in no obvious distress.  Vital signs show temperature of 97.8.  Pulse 72.  Blood pressure 134/85.  Weight is 232 pounds.  Head exam shows no ocular or oral lesions.  He has no adenopathy in the cervical or supraclavicular lymph node regions.  Thyroid is nonpalpable.  Axillary exam shows no bilateral axillary adenopathy.  Lungs are clear bilaterally.  Cardiac exam regular rate and rhythm with no murmurs, rubs or bruits.  Abdomen is soft.  He is mildly obese.  He has good bowel sounds.  There is no fluid wave.  There is no palpable liver or spleen tip.  I do not palpate any inguinal lymph nodes.  Back exam shows no tenderness over the spine, ribs or hips.  Extremities shows no clubbing, cyanosis or edema.  Neurological exam shows no focal neurological deficits.  Skin exam shows no rashes, ecchymoses or petechia.  @IPVITALS @   Recent Labs    06/18/17 1223  WBC 5.2  HGB 12.7*  HCT 39.0  PLT 209   No results for input(s): NA, K, CL, CO2, GLUCOSE, BUN, CREATININE, CALCIUM in the last 72 hours.  Blood smear review: Normochromic and normocytic population of red blood cells.  I see no nucleated red blood cells.  There are no teardrop cells.  There are no rouleaux formation.  There is no schistocytes.  There is no inclusion bodies.  White cells show good maturity.  There are no hypersegmented polys.  I really do not see any immature lymphoid  cells.  He has a couple larger lymphoid cells.  I saw no blasts.  Platelets are adequate in number and size.  Pathology: None    Assessment and Plan: Daniel Waters is a 61 year old white male.  He has no obvious hematologic issue from my point of view.  I have never seen a report that was given to  his cardiologist.  This is quite unusual.  I think that the only test we have to run is a flow cytometry study.  This will tell us if there is a monoclonal population of lymphocytes.  If he does have a monoclonal population of lymphocytes, then by definition, that would define him as a lymphoproliferative process.  However, at this clearly is not the primary issue from my point of view.  If there is any cardiac problem, this really needs to be addressed.  There is no contraindication for him to have a cardiac cath and be on anticoagulation/antiplatelet drugs if he has any lymphoproliferative process.  Daniel Waters is incredibly anxious.  I understand this.  We tried to reassure him.  I told him that even if he had a "blood cancer" that he does not need to be treated right now.  He may never need to be treated.  I do not see that we have to get him back to the office.  We will call him about the flow cytometry results.  It was nice to see he and his wife.  I spent about 45 minutes with both of them.  I answered their questions.  I reviewed his lab work with him.  I try to settle him down and give him some "peace of mind.

## 2017-06-18 NOTE — Telephone Encounter (Signed)
Spoke with wife, Marylene Landngela. Advised that cath has been rescheduled for tomorrow at 2:30 pm in order to prevent from having to do repeat lab work. Marylene Landngela states that the patient has an appointment in [place] tomorrow morning and that the patient was planning to go out on FMLA for this. Marylene Landngela will talk to the patient and return call.

## 2017-06-18 NOTE — Addendum Note (Signed)
Addended by: Ayesha MohairWELLS, Ardenia Stiner E on: 06/18/2017 03:46 PM   Modules accepted: Orders

## 2017-06-19 ENCOUNTER — Ambulatory Visit: Payer: BLUE CROSS/BLUE SHIELD | Admitting: Cardiology

## 2017-06-19 ENCOUNTER — Ambulatory Visit (INDEPENDENT_AMBULATORY_CARE_PROVIDER_SITE_OTHER): Payer: BLUE CROSS/BLUE SHIELD | Admitting: Cardiology

## 2017-06-19 DIAGNOSIS — Z01810 Encounter for preprocedural cardiovascular examination: Secondary | ICD-10-CM

## 2017-06-19 NOTE — Progress Notes (Signed)
Patient here today for repeat EKG and lab work prior to cath. Reinforced cath instructions regarding diet and medications. New letter with date and MD performing cath given. Patient did appear less anxious today. Advised patient to make cath lab aware of how nervous he is and also advised cath scheduler that patient is anxious. No further questions.

## 2017-06-20 LAB — BASIC METABOLIC PANEL
BUN/Creatinine Ratio: 18 (ref 9–20)
BUN: 18 mg/dL (ref 6–24)
CALCIUM: 9.9 mg/dL (ref 8.7–10.2)
CO2: 24 mmol/L (ref 20–29)
CREATININE: 1.02 mg/dL (ref 0.76–1.27)
Chloride: 104 mmol/L (ref 96–106)
GFR calc Af Amer: 93 mL/min/{1.73_m2} (ref >59)
GFR, EST NON AFRICAN AMERICAN: 80 mL/min/{1.73_m2} (ref >59)
Glucose: 93 mg/dL (ref 65–99)
Potassium: 4.3 mmol/L (ref 3.5–5.2)
Sodium: 143 mmol/L (ref 134–144)

## 2017-06-20 LAB — PROTIME-INR
INR: 0.9 (ref 0.8–1.2)
Prothrombin Time: 9.7 s (ref 9.1–12.0)

## 2017-06-24 ENCOUNTER — Encounter (HOSPITAL_COMMUNITY)
Admission: RE | Disposition: A | Discharge status: Home / Self Care | Payer: Self-pay | Source: Ambulatory Visit | Attending: Cardiology

## 2017-06-24 ENCOUNTER — Telehealth: Payer: Self-pay | Admitting: Cardiology

## 2017-06-24 ENCOUNTER — Ambulatory Visit (HOSPITAL_COMMUNITY)
Admission: RE | Admit: 2017-06-24 | Discharge: 2017-06-24 | Disposition: A | Discharge status: Home / Self Care | Payer: BLUE CROSS/BLUE SHIELD | Source: Ambulatory Visit | Attending: Cardiology | Admitting: Cardiology

## 2017-06-24 DIAGNOSIS — E559 Vitamin D deficiency, unspecified: Secondary | ICD-10-CM | POA: Diagnosis not present

## 2017-06-24 DIAGNOSIS — J45909 Unspecified asthma, uncomplicated: Secondary | ICD-10-CM | POA: Diagnosis not present

## 2017-06-24 DIAGNOSIS — Z7982 Long term (current) use of aspirin: Secondary | ICD-10-CM | POA: Insufficient documentation

## 2017-06-24 DIAGNOSIS — M109 Gout, unspecified: Secondary | ICD-10-CM | POA: Diagnosis not present

## 2017-06-24 DIAGNOSIS — R079 Chest pain, unspecified: Secondary | ICD-10-CM | POA: Diagnosis present

## 2017-06-24 DIAGNOSIS — Z8249 Family history of ischemic heart disease and other diseases of the circulatory system: Secondary | ICD-10-CM | POA: Insufficient documentation

## 2017-06-24 DIAGNOSIS — I2510 Atherosclerotic heart disease of native coronary artery without angina pectoris: Secondary | ICD-10-CM | POA: Diagnosis not present

## 2017-06-24 DIAGNOSIS — I10 Essential (primary) hypertension: Secondary | ICD-10-CM | POA: Diagnosis not present

## 2017-06-24 DIAGNOSIS — R9439 Abnormal result of other cardiovascular function study: Secondary | ICD-10-CM

## 2017-06-24 DIAGNOSIS — G4730 Sleep apnea, unspecified: Secondary | ICD-10-CM | POA: Diagnosis not present

## 2017-06-24 DIAGNOSIS — M1990 Unspecified osteoarthritis, unspecified site: Secondary | ICD-10-CM | POA: Insufficient documentation

## 2017-06-24 DIAGNOSIS — H902 Conductive hearing loss, unspecified: Secondary | ICD-10-CM | POA: Insufficient documentation

## 2017-06-24 DIAGNOSIS — E781 Pure hyperglyceridemia: Secondary | ICD-10-CM | POA: Diagnosis not present

## 2017-06-24 DIAGNOSIS — E785 Hyperlipidemia, unspecified: Secondary | ICD-10-CM | POA: Diagnosis present

## 2017-06-24 DIAGNOSIS — I2584 Coronary atherosclerosis due to calcified coronary lesion: Secondary | ICD-10-CM | POA: Insufficient documentation

## 2017-06-24 DIAGNOSIS — R931 Abnormal findings on diagnostic imaging of heart and coronary circulation: Secondary | ICD-10-CM | POA: Diagnosis present

## 2017-06-24 HISTORY — PX: LEFT HEART CATH AND CORONARY ANGIOGRAPHY: CATH118249

## 2017-06-24 HISTORY — PX: CORONARY PRESSURE/FFR STUDY: CATH118243

## 2017-06-24 LAB — POCT ACTIVATED CLOTTING TIME: ACTIVATED CLOTTING TIME: 252 s

## 2017-06-24 LAB — FLOW CYTOMETRY - CHCC SATELLITE

## 2017-06-24 SURGERY — LEFT HEART CATH AND CORONARY ANGIOGRAPHY
Anesthesia: LOCAL

## 2017-06-24 MED ORDER — SODIUM CHLORIDE 0.9 % WEIGHT BASED INFUSION: 1.0000 mL/kg/h | INTRAVENOUS | Status: DC

## 2017-06-24 MED ORDER — LIDOCAINE HCL (PF) 1 % IJ SOLN
INTRAMUSCULAR | Status: AC
  Filled 2017-06-24: qty 30

## 2017-06-24 MED ORDER — SODIUM CHLORIDE 0.9 % IV SOLN: 250.0000 mL | INTRAVENOUS | Status: DC | PRN

## 2017-06-24 MED ORDER — HEPARIN (PORCINE) IN NACL 2-0.9 UNIT/ML-% IJ SOLN
INTRAMUSCULAR | Status: AC | PRN
  Administered 2017-06-24: 1500 mL via INTRA_ARTERIAL

## 2017-06-24 MED ORDER — ADENOSINE (DIAGNOSTIC) 140MCG/KG/MIN
INTRAVENOUS | Status: DC | PRN
  Administered 2017-06-24: 140 ug/kg/min via INTRAVENOUS

## 2017-06-24 MED ORDER — VERAPAMIL HCL 2.5 MG/ML IV SOLN
INTRAVENOUS | Status: AC
Start: 2017-06-24 — End: 2017-06-24
  Filled 2017-06-24: qty 2

## 2017-06-24 MED ORDER — SODIUM CHLORIDE 0.9 % WEIGHT BASED INFUSION: 1.0000 mL/kg/h | INTRAVENOUS | Status: AC

## 2017-06-24 MED ORDER — SODIUM CHLORIDE 0.9% FLUSH
3.0000 mL | Freq: Two times a day (BID) | INTRAVENOUS | Status: DC
Start: 2017-06-24 — End: 2017-06-24

## 2017-06-24 MED ORDER — ASPIRIN 81 MG PO CHEW: 81.0000 mg | CHEWABLE_TABLET | ORAL | Status: DC

## 2017-06-24 MED ORDER — IOPAMIDOL (ISOVUE-370) INJECTION 76%
INTRAVENOUS | Status: DC | PRN
  Administered 2017-06-24: 100 mL via INTRA_ARTERIAL

## 2017-06-24 MED ORDER — SODIUM CHLORIDE 0.9% FLUSH: 3.0000 mL | INTRAVENOUS | Status: DC | PRN

## 2017-06-24 MED ORDER — ADENOSINE 12 MG/4ML IV SOLN
INTRAVENOUS | Status: AC
Start: 2017-06-24 — End: 2017-06-24
  Filled 2017-06-24: qty 12

## 2017-06-24 MED ORDER — MIDAZOLAM HCL 2 MG/2ML IJ SOLN
INTRAMUSCULAR | Status: AC
  Filled 2017-06-24: qty 2

## 2017-06-24 MED ORDER — LIDOCAINE HCL (PF) 1 % IJ SOLN
INTRAMUSCULAR | Status: DC | PRN
  Administered 2017-06-24: 2 mL via INTRADERMAL

## 2017-06-24 MED ORDER — FENTANYL CITRATE (PF) 100 MCG/2ML IJ SOLN
INTRAMUSCULAR | Status: AC
  Filled 2017-06-24: qty 2

## 2017-06-24 MED ORDER — HEPARIN SODIUM (PORCINE) 1000 UNIT/ML IJ SOLN
INTRAMUSCULAR | Status: DC | PRN
  Administered 2017-06-24 (×2): 5000 [IU] via INTRAVENOUS

## 2017-06-24 MED ORDER — HEPARIN SODIUM (PORCINE) 1000 UNIT/ML IJ SOLN
INTRAMUSCULAR | Status: AC
Start: 2017-06-24 — End: 2017-06-24
  Filled 2017-06-24: qty 1

## 2017-06-24 MED ORDER — HEPARIN SODIUM (PORCINE) 1000 UNIT/ML IJ SOLN
INTRAMUSCULAR | Status: AC
  Filled 2017-06-24: qty 1

## 2017-06-24 MED ORDER — VERAPAMIL HCL 2.5 MG/ML IV SOLN
INTRAVENOUS | Status: AC
  Filled 2017-06-24: qty 2

## 2017-06-24 MED ORDER — NITROGLYCERIN 1 MG/10 ML FOR IR/CATH LAB
INTRA_ARTERIAL | Status: DC | PRN
  Administered 2017-06-24: 200 ug via INTRACORONARY

## 2017-06-24 MED ORDER — VERAPAMIL HCL 2.5 MG/ML IV SOLN
INTRAVENOUS | Status: DC | PRN
  Administered 2017-06-24: 10 mL via INTRA_ARTERIAL

## 2017-06-24 MED ORDER — FENTANYL CITRATE (PF) 100 MCG/2ML IJ SOLN
INTRAMUSCULAR | Status: DC | PRN
  Administered 2017-06-24: 50 ug via INTRAVENOUS
  Administered 2017-06-24: 25 ug via INTRAVENOUS

## 2017-06-24 MED ORDER — SODIUM CHLORIDE 0.9 % WEIGHT BASED INFUSION
3.0000 mL/kg/h | INTRAVENOUS | Status: AC
  Administered 2017-06-24: 3 mL/kg/h via INTRAVENOUS

## 2017-06-24 MED ORDER — HEPARIN (PORCINE) IN NACL 2-0.9 UNIT/ML-% IJ SOLN
INTRAMUSCULAR | Status: AC
  Filled 2017-06-24: qty 1000

## 2017-06-24 MED ORDER — SODIUM CHLORIDE 0.9% FLUSH: 3.0000 mL | Freq: Two times a day (BID) | INTRAVENOUS | Status: DC

## 2017-06-24 MED ORDER — ADENOSINE 12 MG/4ML IV SOLN
INTRAVENOUS | Status: AC
  Filled 2017-06-24: qty 4

## 2017-06-24 MED ORDER — MIDAZOLAM HCL 2 MG/2ML IJ SOLN
INTRAMUSCULAR | Status: DC | PRN
  Administered 2017-06-24: 2 mg via INTRAVENOUS
  Administered 2017-06-24: 1 mg via INTRAVENOUS

## 2017-06-24 SURGICAL SUPPLY — 15 items
CATH INFINITI 5 FR JL3.5 (CATHETERS) ×2 IMPLANT
CATH INFINITI 5FR ANG PIGTAIL (CATHETERS) ×2 IMPLANT
CATH INFINITI JR4 5F (CATHETERS) ×2 IMPLANT
CATH VISTA GUIDE 6FR XBLAD3.5 (CATHETERS) ×2 IMPLANT
DEVICE RAD COMP TR BAND LRG (VASCULAR PRODUCTS) ×2 IMPLANT
GLIDESHEATH SLEND SS 6F .021 (SHEATH) ×2 IMPLANT
GUIDEWIRE INQWIRE 1.5J.035X260 (WIRE) ×1 IMPLANT
GUIDEWIRE PRESSURE COMET II (WIRE) ×2 IMPLANT
INQWIRE 1.5J .035X260CM (WIRE) ×2
KIT ENCORE 26 ADVANTAGE (KITS) IMPLANT
KIT HEART LEFT (KITS) ×2 IMPLANT
PACK CARDIAC CATHETERIZATION (CUSTOM PROCEDURE TRAY) ×2 IMPLANT
SYR MEDRAD MARK V 150ML (SYRINGE) ×2 IMPLANT
TRANSDUCER W/STOPCOCK (MISCELLANEOUS) ×2 IMPLANT
TUBING CIL FLEX 10 FLL-RA (TUBING) ×2 IMPLANT

## 2017-06-24 NOTE — Discharge Instructions (Signed)

## 2017-06-24 NOTE — Telephone Encounter (Signed)
Returned call to patient's wife.Message was already sent to [facility].They will call with appointment.

## 2017-06-24 NOTE — Telephone Encounter (Signed)
New message    Patient wife calling to inquire about a conversation that was had with Dr SwazilandJordan regarding triple bypass surgery needed. Please call

## 2017-06-24 NOTE — Telephone Encounter (Signed)
Message already sent to [facility] and they will call with appointment.  Daniel Lahaie SwazilandJordan MD, [facility]

## 2017-06-24 NOTE — Telephone Encounter (Signed)
Returned the call to the patient's wife, per the dpr. She stated that she would like to get an appointment to see the cardio thoracic surgeon for a possible CABG as soon as possible. She stated that this was a conversation that she had today with Dr. SwazilandJordan. Will route to the provider and his nurse for their knowledge.

## 2017-06-24 NOTE — Interval H&P Note (Signed)
History and Physical Interval Note:  06/24/2017 10:12 AM  Daniel Waters  has presented today for surgery, with the diagnosis of abnormal stress  The various methods of treatment have been discussed with the patient and family. After consideration of risks, benefits and other options for treatment, the patient has consented to  Procedure(s): LEFT HEART CATH AND CORONARY ANGIOGRAPHY (N/A) as a surgical intervention .  The patient's history has been reviewed, patient examined, no change in status, stable for surgery.  I have reviewed the patient's chart and labs.  Questions were answered to the patient's satisfaction.   Cath Lab Visit (complete for each Cath Lab visit)  Clinical Evaluation Leading to the Procedure:   ACS: No.  Non-ACS:    Anginal Classification: CCS II  Anti-ischemic medical therapy: Minimal Therapy (1 class of medications)  Non-Invasive Test Results: High-risk stress test findings: cardiac mortality >3%/year  Prior CABG: No previous CABG        Theron AristaPeter [facility] HospitalJordanMD,FACC 06/24/2017 10:12 AM

## 2017-06-25 ENCOUNTER — Encounter (HOSPITAL_COMMUNITY): Payer: Self-pay | Admitting: Cardiology

## 2017-07-01 ENCOUNTER — Ambulatory Visit: Payer: BLUE CROSS/BLUE SHIELD | Admitting: Cardiology

## 2017-07-02 ENCOUNTER — Encounter: Payer: Self-pay | Admitting: Surgery

## 2017-07-02 ENCOUNTER — Institutional Professional Consult (permissible substitution) (INDEPENDENT_AMBULATORY_CARE_PROVIDER_SITE_OTHER): Payer: BLUE CROSS/BLUE SHIELD | Admitting: Surgery

## 2017-07-02 ENCOUNTER — Other Ambulatory Visit: Payer: Self-pay

## 2017-07-02 VITALS — BP 115/79 | HR 79 | Resp 16 | Ht 73.0 in | Wt 232.0 lb

## 2017-07-02 DIAGNOSIS — R9439 Abnormal result of other cardiovascular function study: Secondary | ICD-10-CM | POA: Diagnosis not present

## 2017-07-02 DIAGNOSIS — I2510 Atherosclerotic heart disease of native coronary artery without angina pectoris: Secondary | ICD-10-CM

## 2017-07-03 ENCOUNTER — Encounter: Payer: Self-pay | Admitting: Surgery

## 2017-07-03 NOTE — Progress Notes (Signed)
Cardiothoracic Surgery Consultation  PCP is Wendling, Jilda RocheNicholas Paul, DO Referring Provider is SwazilandJordan, Peter M, MD  Chief Complaint  Patient presents with  . Coronary Artery Disease    abnormal exercise cardiac perfusion study, CATH 06/24/17  . Chest Pain    HPI:  The patient is a 61 year old gentleman with a long history of hypertriglyceridemia, hypertension and remote smoking who had no prior cardiac history and is very active exercising vigorously several days per week. He mentioned to his PCP that he had some pain in his left arm with activity recently and had an ECG that was abnormal. He was seen by Dr. Dulce SellarMunley and had a exercise myocardial perfusion study that was a high risk study with markedly abnormal ECG changes prompting discontinuation of the study. His EF was 48%. There was a medium defect of moderate severity in the apical lateral walls consistent with ischemia. He underwent cardiac cath on 06/24/2017 showing severe 2 vessel CAD with a complex calcified proximal to mid LAD stenosis of 70% with a markedly abnormal FFR of 0.73. The D1 had 99% stenosis at a bifurcation. The RCA was diffusely diseased with calcific plaque with an 85% mid vessel stenosis. LV function was normal with normal LVEDP. The patient says that he has not been doing any exercise since his cath and has had no chest or arm pain. He denies shortness of breath and feels like his stamina is normal. He is somewhat in shock about the diagnosis.  Past Medical History:  Diagnosis Date  . Arthritis   . Asthma   . Benign essential HTN 05/20/2017  . Chest pain 05/20/2017  . Conductive hearing loss of both ears 05/01/2017  . History of gout 12/03/2016  . Hypertriglyceridemia 04/17/2017  . Sleep apnea 05/20/2017  . Vitamin D deficiency 05/20/2017    Past Surgical History:  Procedure Laterality Date  . CARPAL TUNNEL RELEASE    . INTRAVASCULAR PRESSURE WIRE/FFR STUDY N/A 06/24/2017   Procedure: INTRAVASCULAR  PRESSURE WIRE/FFR STUDY;  Surgeon: SwazilandJordan, Peter M, MD;  [facility];  Service: Cardiovascular;  Laterality: N/A;  . knee Bilateral   . LEFT HEART CATH AND CORONARY ANGIOGRAPHY N/A 06/24/2017   Procedure: LEFT HEART CATH AND CORONARY ANGIOGRAPHY;  Surgeon: SwazilandJordan, Peter M, MD;  [facility];  Service: Cardiovascular;  Laterality: N/A;    Family History  Problem Relation Age of Onset  . Heart attack Father [id number]       No known heart disease  . Cancer Paternal Grandmother   . Stroke Paternal Grandfather     Social History Social History   Tobacco Use  . Smoking status: Former Smoker    Packs/day: 1.00    Years: 4.00    Pack years: 4.00    Types: Cigarettes  . Smokeless tobacco: Never Used  . Tobacco comment: SMOKED ONLY IN COLLEGE  Substance Use Topics  . Alcohol use: Yes  . Drug use: No    Current Outpatient Medications  Medication Sig Dispense Refill  . acetaminophen (TYLENOL) 500 MG tablet Take 500 mg by mouth daily as needed for moderate pain.    Marland Kitchen. allopurinol (ZYLOPRIM) 100 MG tablet Take 1 tablet (100 mg total) by mouth daily. 30 tablet 6  . aspirin 81 MG chewable tablet Chew 81 mg daily by mouth.    Marland Kitchen. atorvastatin (LIPITOR) 40 MG tablet Take 40 mg by mouth daily.     . Cyanocobalamin (B-12) 2500 MCG TABS Take  2,500 mcg by mouth daily.    . diphenhydramine-acetaminophen (TYLENOL PM) 25-500 MG TABS tablet Take 1 tablet by mouth at bedtime as needed (sleep).    . fenofibrate (TRICOR) 145 MG tablet Take 145 mg by mouth daily.     Boris Lown. Krill Oil 350 MG CAPS Take 350 mg by mouth daily.    Marland Kitchen. lisinopril (PRINIVIL,ZESTRIL) 5 MG tablet Take 5 mg by mouth daily.  2  . LORazepam (ATIVAN) 1 MG tablet Take 1 tablet (1 mg total) as needed by mouth for anxiety. Take 1 tablet by mouth daily as needed for anxiety. 15 tablet 0  . metoprolol tartrate (LOPRESSOR) 25 MG tablet Take 1 tablet (25 mg total) by mouth 2 (two) times daily. 60 tablet 3  . nitroGLYCERIN  (NITROSTAT) 0.4 MG SL tablet Place 1 tablet (0.4 mg total) under the tongue every 5 (five) minutes as needed for chest pain. 25 tablet 12   No current facility-administered medications for this visit.     Allergies  Allergen Reactions  . Sulfur Nausea And Vomiting and Rash    Review of Systems  Constitutional: Positive for activity change. Negative for diaphoresis and fatigue.  HENT: Positive for hearing loss.   Eyes: Negative.   Respiratory: Negative for chest tightness and shortness of breath.   Cardiovascular: Negative for chest pain, palpitations and leg swelling.  Gastrointestinal: Negative.   Endocrine: Negative.   Genitourinary: Negative.   Musculoskeletal: Negative.   Skin: Negative.   Allergic/Immunologic: Negative.   Neurological: Negative for dizziness, syncope, speech difficulty, weakness, numbness and headaches.       Hx of TIA in 2008  Hematological: Negative.   Psychiatric/Behavioral: The patient is nervous/anxious.     BP 115/79 (BP Location: Right Arm, Patient Position: Sitting, Cuff Size: Large)   Pulse 79   Resp 16   Ht 6\' 1"  (1.854 m)   Wt 232 lb (105.2 kg)   SpO2 99% Comment: ON RA  BMI 30.61 kg/m  Physical Exam  Constitutional: He is oriented to person, place, and time. He appears well-developed and well-nourished. No distress.  HENT:  Head: Normocephalic and atraumatic.  Mouth/Throat: Oropharynx is clear and moist.  Eyes: EOM are normal. Pupils are equal, round, and reactive to light.  Neck: Normal range of motion. Neck supple. No JVD present. No thyromegaly present.  Cardiovascular: Normal rate, regular rhythm, normal heart sounds and intact distal pulses.  No murmur heard. Pulmonary/Chest: Effort normal and breath sounds normal. No respiratory distress.  Abdominal: Soft. Bowel sounds are normal. He exhibits no distension and no mass. There is no tenderness.  Musculoskeletal: Normal range of motion. He exhibits no edema.  Lymphadenopathy:    He  has no cervical adenopathy.  Neurological: He is alert and oriented to person, place, and time. He has normal strength. No cranial nerve deficit or sensory deficit.  Skin: Skin is warm and dry.  Psychiatric: He has a normal mood and affect.    Diagnostic Tests:  Study Highlights     Nuclear stress EF: 48%. No significant wall motion abnormalities noted  Horizontal ST segment depression ST segment depression of 2 mm was noted during stress in the II, III, aVF, V5 and V6 leads.  Defect 1: There is a medium defect of moderate severity present in the apical lateral location.  Findings consistent with ischemia.  This is a high risk exercise treadmill test secondary to markedly abnormal ST segment depression during exercise and intermediate risk perfusion study demonstrating apical lateral ischemia.  Donato Schultz, MD     Physicians   Panel Physicians Referring Physician Case Authorizing Physician  [place], Peter M, MD (Primary)    Procedures   INTRAVASCULAR PRESSURE WIRE/FFR STUDY  LEFT HEART CATH AND CORONARY ANGIOGRAPHY  Conclusion     Mid RCA lesion is 85% stenosed.  Prox LAD lesion is 50% stenosed.  Prox LAD to Mid LAD lesion is 70% stenosed.  1st Diag lesion is 99% stenosed.  The left ventricular systolic function is normal.  LV end diastolic pressure is normal.  The left ventricular ejection fraction is greater than 65% by visual estimate.   1. Severe 2 vessel obstructive CAD     - Complex calcified stenosis in the proximal to mid LAD 70% with markedly abnormal FFR of 0.73    - 99% mid first diagonal at bifurcation point    - 85% mid RCA. Complex lesion with heavy calcification 2. Normal LV function 3. Normal LVEDP.  Plan: will discuss revascularization options. Given heavy calcification and complex lesion morphology I feel the patient would be best served by surgical revascularization with CABG.    Indications   Abnormal nuclear cardiac imaging  test [R93.1 (ICD-10-CM)]  Procedural Details/Technique   Technical Details Indication: 61 yo WM with recent chest pain and abnormal nuclear stress test.  Procedural Details: The right wrist was prepped, draped, and anesthetized with 1% lidocaine. Using the modified Seldinger technique, a 6 French slender sheath was introduced into the right radial artery. 3 mg of verapamil was administered through the sheath, weight-based unfractionated heparin was administered intravenously. Standard Judkins catheters were used for selective coronary angiography and left ventriculography. Catheter exchanges were performed over an exchange length guidewire. Following angiography we performed FFR of the LAD. The patient was fully anticoagulated with IV heparin. The LCA was engaged with a XBLAD 3.5 guide. The lesion was crossed with a Comet flow wire. FFR was measured with maximal hyperemia with IV adenosine and was 0.73. There were no immediate procedural complications. A TR band was used for radial hemostasis at the completion of the procedure. The patient was transferred to the post catheterization recovery area for further monitoring.  Contrast: 100 cc   Estimated blood loss <50 mL.  During this procedure the patient was administered the following to achieve and maintain moderate conscious sedation: Versed 3 mg, Fentanyl 75 mcg, while the patient's heart rate, blood pressure, and oxygen saturation were continuously monitored. The period of conscious sedation was 42 minutes, of which I was present face-to-face 100% of this time.  Complications   Complications documented before study signed (06/24/2017 11:13 AM EST)    No complications were associated with this study.  Documented by [place], Peter M, MD - 06/24/2017 11:09 AM EST    Coronary Findings   Diagnostic  Dominance: Right  Left Main  Vessel was injected. Vessel is normal in caliber. The vessel exhibits minimal luminal irregularities. The vessel is  severely calcified.  Left Anterior Descending  Prox LAD lesion 50% stenosed  Prox LAD lesion is 50% stenosed. The lesion is severely calcified.  Prox LAD to Mid LAD lesion 70% stenosed  Prox LAD to Mid LAD lesion is 70% stenosed. The lesion is eccentric. The lesion is moderately calcified. Pressure wire/FFR was performed on the lesion. FFR: 0.73.  First Diagonal Branch  1st Diag lesion 99% stenosed  1st Diag lesion is 99% stenosed.  Left Circumflex  Vessel was injected. Vessel is normal in caliber. The vessel exhibits minimal luminal irregularities.  Right  Coronary Artery  Vessel is large.  Mid RCA lesion 85% stenosed  Mid RCA lesion is 85% stenosed. The lesion is eccentric and irregular. The lesion is severely calcified.  Intervention   No interventions have been documented.  Wall Motion      All segments of the heart are normal.          Left Heart   Left Ventricle The left ventricular size is normal. The left ventricular systolic function is normal. LV end diastolic pressure is normal. The left ventricular ejection fraction is greater than 65% by visual estimate. No regional wall motion abnormalities.  Coronary Diagrams   Diagnostic Diagram       Implants     No implant documentation for this case.  MERGE Images   Show images for CARDIAC CATHETERIZATION   Link to Procedure Log   Procedure Log    Hemo Data    Most Recent Value  AO Systolic Pressure 0 mmHg  AO Diastolic Pressure -7 mmHg  AO Mean -5 mmHg  LV Systolic Pressure 117 mmHg  LV Diastolic Pressure 3 mmHg  LV EDP 8 mmHg  Arterial Occlusion Pressure Extended Systolic Pressure 111 mmHg  Arterial Occlusion Pressure Extended Diastolic Pressure 66 mmHg  Arterial Occlusion Pressure Extended Mean Pressure 86 mmHg  Left Ventricular Apex Extended Systolic Pressure 114 mmHg  Left Ventricular Apex Extended Diastolic Pressure 3 mmHg  Left Ventricular Apex Extended EDP Pressure 9 mmHg    Impression:  This  gentleman has severe 2-vessel coronary artery disease with a markedly abnormal LAD FFR and a high risk nuclear stress test. I agree that CABG is the best treatment for him. I reviewed his cath films with him and his wife and answered their questions. I discussed the operative procedure with the patient and his wife including alternatives, benefits and risks; including but not limited to bleeding, blood transfusion, infection, stroke, myocardial infarction, graft failure, heart block requiring a permanent pacemaker, organ dysfunction, and death.  [name] understands and agrees to proceed.     Plan:  CABG scheduled for 07/12/2017.    I spent 60 minutes performing this consultation and > 50% of this time was spent face to face counseling and coordinating the care of this patient's severe 2-vessel CAD.    [name], MD [facility] [phone]

## 2017-07-05 ENCOUNTER — Telehealth: Payer: Self-pay | Admitting: Cardiology

## 2017-07-05 MED ORDER — LORAZEPAM 1 MG PO TABS: 1.0000 mg | ORAL_TABLET | ORAL | 0 refills | Status: DC | PRN

## 2017-07-05 NOTE — Telephone Encounter (Signed)
Please advise 

## 2017-07-05 NOTE — Telephone Encounter (Signed)
Marylene Landngela states that patient still has 3 tablets of lorazepam. Dr. Dulce SellarMunley advised to prescribe patient 5 more tablets and that we will not be refilling lorazepam anymore. Marylene Landngela verbalized understanding and will come pick up the prescription on Monday. No further questions.

## 2017-07-05 NOTE — Telephone Encounter (Signed)
°*  STAT* If patient is at the pharmacy, call can be transferred to refill team.   1. Which medications need to be refilled? (please list name of each medication and dose if known) Lorazepam   2. Which pharmacy/location (including street and city if local pharmacy) is medication to be sent to[place] [facility] [facility] [place] [facility]  3. Do they need a 30 day or 90 day supply?  Patient is scheduled for triple bypass next Friday 07/12/2017 and wants to last til then per wife.

## 2017-07-09 NOTE — Pre-Procedure Instructions (Signed)
Daniel Waters  07/09/2017      [id number] - [place], [place] - [address] [place] [id number] Phone: [phone] Fax: [phone]  [facility] [place], [place] - [id number] [place] [address] Phone: [phone] Fax: [phone]    Your procedure is scheduled on 07-12-2017 Friday.  [facility] Admitting at 5:30 A.M.   Call this number if you have problems the morning of surgery:  [phone]   Remember:  Do not eat food or drink liquids after midnight.   Take these medicines the morning of surgery with A SIP OF WATER Tylenol if needed,allopurinol(Zyloprim),atorvastatin(Lipitor),fenofibrate(Tricor),Lorazepam(Ativan) if needed,metoprolol(Lopressor),   STOP ASPIRIN,ANTIINFLAMATORIES (IBUPROFEN,ALEVE,MOTRIN,ADVIL,GOODY'S POWDERS),HERBAL SUPPLEMENTS,FISH OIL,AND VITAMINS 5-7 DAYS PRIOR TO SURGERY   Do not wear jewelry, make-up or nail polish.  Do not wear lotions, powders, or perfumes, or deoderant.  Do not shave 48 hours prior to surgery.  Men may shave face and neck.  Do not bring valuables to the hospital.  [facility] is not responsible for any belongings or valuables.  Contacts, dentures or bridgework may not be worn into surgery.  Leave your suitcase in the car.  After surgery it may be brought to your room.  For patients admitted to the hospital, discharge time will be determined by your treatment team.  Patients discharged the day of surgery will not be allowed to drive home.    Special Instructions: Daniel Waters - Preparing for Surgery  Before surgery, you can play an important role.  Because skin is not sterile, your skin needs to be as free of germs as possible.  You can reduce the number of germs on you skin by washing with CHG (chlorahexidine gluconate) soap before surgery.  CHG is an antiseptic cleaner which kills germs and bonds with the skin  to continue killing germs even after washing.  Please DO NOT use if you have an allergy to CHG or antibacterial soaps.  If your skin becomes reddened/irritated stop using the CHG and inform your nurse when you arrive at Short Stay.  Do not shave (including legs and underarms) for at least 48 hours prior to the first CHG shower.  You may shave your face.  Please follow these instructions carefully:   1.  Shower with CHG Soap the night before surgery and the   morning of Surgery.  2.  If you choose to wash your hair, wash your hair first as usual with your normal shampoo.  3.  After you shampoo, rinse your hair and body thoroughly to remove the  Shampoo.  4.  Use CHG as you would any other liquid soap.  You can apply chg directly  to the skin and wash gently with scrungie or a clean washcloth.  5.  Apply the CHG Soap to your body ONLY FROM THE NECK DOWN.   Do not use on open wounds or open sores.  Avoid contact with your eyes,  ears, mouth and genitals (private parts).  Wash genitals (private parts) with your normal soap.  6.  Wash thoroughly, paying special attention to the area where your surgery will be performed.  7.  Thoroughly rinse your body with warm water from the neck down.  8.  DO NOT shower/wash with your normal soap after using and rinsing o  the CHG Soap.  9.  Pat yourself dry with a clean towel.  10.  Wear clean pajamas.            11.  Place clean sheets on your bed the night of your first shower and do not sleep with pets.  Day of Surgery  Do not apply any lotions/deodorants the morning of surgery.  Please wear clean clothes to the hospital/surgery center.   Please read over the following fact sheets that you were given. Pain Booklet, Coughing and Deep Breathing, MRSA Information and Surgical Site Infection Prevention

## 2017-07-10 ENCOUNTER — Ambulatory Visit (HOSPITAL_COMMUNITY)
Admission: RE | Admit: 2017-07-10 | Discharge: 2017-07-10 | Disposition: A | Discharge status: Home / Self Care | Payer: BLUE CROSS/BLUE SHIELD | Source: Ambulatory Visit | Attending: Surgery | Admitting: Surgery

## 2017-07-10 ENCOUNTER — Encounter (HOSPITAL_COMMUNITY)
Admission: RE | Admit: 2017-07-10 | Discharge: 2017-07-10 | Disposition: A | Discharge status: Home / Self Care | Payer: BLUE CROSS/BLUE SHIELD | Source: Ambulatory Visit | Attending: Surgery | Admitting: Surgery

## 2017-07-10 ENCOUNTER — Ambulatory Visit (HOSPITAL_BASED_OUTPATIENT_CLINIC_OR_DEPARTMENT_OTHER)
Admission: RE | Admit: 2017-07-10 | Discharge: 2017-07-10 | Disposition: A | Discharge status: Home / Self Care | Payer: BLUE CROSS/BLUE SHIELD | Source: Ambulatory Visit | Attending: Surgery | Admitting: Surgery

## 2017-07-10 ENCOUNTER — Encounter (HOSPITAL_COMMUNITY): Payer: Self-pay

## 2017-07-10 ENCOUNTER — Other Ambulatory Visit: Payer: Self-pay

## 2017-07-10 DIAGNOSIS — I700 Atherosclerosis of aorta: Secondary | ICD-10-CM | POA: Diagnosis not present

## 2017-07-10 DIAGNOSIS — Z0183 Encounter for blood typing: Secondary | ICD-10-CM | POA: Insufficient documentation

## 2017-07-10 DIAGNOSIS — I2510 Atherosclerotic heart disease of native coronary artery without angina pectoris: Secondary | ICD-10-CM

## 2017-07-10 DIAGNOSIS — I6523 Occlusion and stenosis of bilateral carotid arteries: Secondary | ICD-10-CM | POA: Diagnosis not present

## 2017-07-10 DIAGNOSIS — Z01812 Encounter for preprocedural laboratory examination: Secondary | ICD-10-CM | POA: Diagnosis not present

## 2017-07-10 DIAGNOSIS — Z01818 Encounter for other preprocedural examination: Secondary | ICD-10-CM | POA: Insufficient documentation

## 2017-07-10 HISTORY — DX: Personal history of urinary calculi: Z87.442

## 2017-07-10 HISTORY — DX: Anxiety disorder, unspecified: F41.9

## 2017-07-10 HISTORY — DX: Atherosclerotic heart disease of native coronary artery without angina pectoris: I25.10

## 2017-07-10 LAB — PULMONARY FUNCTION TEST
DL/VA % pred: 84 %
DL/VA: 4.05 ml/min/mmHg/L
DLCO UNC: 24.22 ml/min/mmHg
DLCO unc % pred: 66 %
FEF 25-75 Post: 4.04 L/sec
FEF 25-75 Pre: 5.05 L/sec
FEF2575-%Change-Post: -19 %
FEF2575-%PRED-POST: 121 %
FEF2575-%Pred-Pre: 152 %
FEV1-%CHANGE-POST: -4 %
FEV1-%PRED-PRE: 96 %
FEV1-%Pred-Post: 92 %
FEV1-POST: 3.73 L
FEV1-Pre: 3.9 L
FEV1FVC-%Change-Post: -4 %
FEV1FVC-%Pred-Pre: 116 %
FEV6-%Change-Post: -1 %
FEV6-%PRED-PRE: 86 %
FEV6-%Pred-Post: 84 %
FEV6-POST: 4.33 L
FEV6-PRE: 4.39 L
FEV6FVC-%PRED-POST: 104 %
FEV6FVC-%PRED-PRE: 104 %
FVC-%Change-Post: 0 %
FVC-%PRED-POST: 83 %
FVC-%PRED-PRE: 83 %
FVC-POST: 4.43 L
FVC-PRE: 4.42 L
POST FEV6/FVC RATIO: 100 %
PRE FEV6/FVC RATIO: 100 %
Post FEV1/FVC ratio: 84 %
Pre FEV1/FVC ratio: 88 %
RV % PRED: 91 %
RV: 2.18 L
TLC % pred: 86 %
TLC: 6.57 L

## 2017-07-10 LAB — BLOOD GAS, ARTERIAL
Acid-base deficit: 1.4 mmol/L (ref 0.0–2.0)
BICARBONATE: 22 mmol/L (ref 20.0–28.0)
Drawn by: 421801
FIO2: 21
O2 Saturation: 97.1 %
PATIENT TEMPERATURE: 98.6
PH ART: 7.452 — AB (ref 7.350–7.450)
PO2 ART: 93.2 mmHg (ref 83.0–108.0)
pCO2 arterial: 32 mmHg (ref 32.0–48.0)

## 2017-07-10 LAB — HEMOGLOBIN A1C
Hgb A1c MFr Bld: 5.8 % — ABNORMAL HIGH (ref 4.8–5.6)
Mean Plasma Glucose: 119.76 mg/dL

## 2017-07-10 LAB — CBC
HEMATOCRIT: 41.6 % (ref 39.0–52.0)
Hemoglobin: 13.9 g/dL (ref 13.0–17.0)
MCH: 32.8 pg (ref 26.0–34.0)
MCHC: 33.4 g/dL (ref 30.0–36.0)
MCV: 98.1 fL (ref 78.0–100.0)
Platelets: 249 10*3/uL (ref 150–400)
RBC: 4.24 MIL/uL (ref 4.22–5.81)
RDW: 13.1 % (ref 11.5–15.5)
WBC: 7.2 10*3/uL (ref 4.0–10.5)

## 2017-07-10 LAB — TYPE AND SCREEN
ABO/RH(D): O POS
ANTIBODY SCREEN: NEGATIVE

## 2017-07-10 LAB — COMPREHENSIVE METABOLIC PANEL
ALT: 19 U/L (ref 17–63)
AST: 23 U/L (ref 15–41)
Albumin: 4.5 g/dL (ref 3.5–5.0)
Alkaline Phosphatase: 45 U/L (ref 38–126)
Anion gap: 12 (ref 5–15)
BILIRUBIN TOTAL: 0.9 mg/dL (ref 0.3–1.2)
BUN: 18 mg/dL (ref 6–20)
CALCIUM: 9.6 mg/dL (ref 8.9–10.3)
CO2: 20 mmol/L — AB (ref 22–32)
CREATININE: 1.1 mg/dL (ref 0.61–1.24)
Chloride: 105 mmol/L (ref 101–111)
GLUCOSE: 96 mg/dL (ref 65–99)
Potassium: 4.4 mmol/L (ref 3.5–5.1)
Sodium: 137 mmol/L (ref 135–145)
Total Protein: 7.4 g/dL (ref 6.5–8.1)

## 2017-07-10 LAB — URINALYSIS, ROUTINE W REFLEX MICROSCOPIC
BILIRUBIN URINE: NEGATIVE
Glucose, UA: NEGATIVE mg/dL
Hgb urine dipstick: NEGATIVE
KETONES UR: NEGATIVE mg/dL
Leukocytes, UA: NEGATIVE
NITRITE: NEGATIVE
PH: 7 (ref 5.0–8.0)
Protein, ur: NEGATIVE mg/dL
Specific Gravity, Urine: 1.01 (ref 1.005–1.030)

## 2017-07-10 LAB — SURGICAL PCR SCREEN
MRSA, PCR: NEGATIVE
STAPHYLOCOCCUS AUREUS: NEGATIVE

## 2017-07-10 LAB — PROTIME-INR
INR: 0.96
PROTHROMBIN TIME: 12.6 s (ref 11.4–15.2)

## 2017-07-10 LAB — ABO/RH: ABO/RH(D): O POS

## 2017-07-10 LAB — APTT: aPTT: 27 seconds (ref 24–36)

## 2017-07-10 MED ORDER — ALBUTEROL SULFATE (2.5 MG/3ML) 0.083% IN NEBU
2.5000 mg | INHALATION_SOLUTION | Freq: Once | RESPIRATORY_TRACT | Status: AC
  Administered 2017-07-10: 2.5 mg via RESPIRATORY_TRACT

## 2017-07-10 NOTE — Progress Notes (Addendum)
PCP is [facility] Cardiologist is Dr. Norman HerrlichBrian Munley EKG noted 06-24-17 Stress test 05-31-17 Card cath 06-24-17 Denies chest pain, fever, or cough.

## 2017-07-10 NOTE — Progress Notes (Signed)
Pre-CABG testing has been completed.  07/10/17 10:50 AM Olen CordialGreg Luke Rigsbee RVT

## 2017-07-11 MED ORDER — SODIUM CHLORIDE 0.9 % IV SOLN
INTRAVENOUS | Status: DC
  Filled 2017-07-11: qty 30

## 2017-07-11 MED ORDER — MILRINONE LACTATE IN DEXTROSE 20-5 MG/100ML-% IV SOLN
0.1250 ug/kg/min | INTRAVENOUS | Status: DC
  Filled 2017-07-11: qty 100

## 2017-07-11 MED ORDER — TRANEXAMIC ACID (OHS) BOLUS VIA INFUSION
15.0000 mg/kg | INTRAVENOUS | Status: AC
  Administered 2017-07-12: 1569 mg via INTRAVENOUS
  Filled 2017-07-11: qty 1569

## 2017-07-11 MED ORDER — MAGNESIUM SULFATE 50 % IJ SOLN
40.0000 meq | INTRAMUSCULAR | Status: DC
  Filled 2017-07-11: qty 9.85

## 2017-07-11 MED ORDER — DOPAMINE-DEXTROSE 3.2-5 MG/ML-% IV SOLN
0.0000 ug/kg/min | INTRAVENOUS | Status: DC
  Filled 2017-07-11: qty 250

## 2017-07-11 MED ORDER — CHLORHEXIDINE GLUCONATE 0.12 % MT SOLN
15.0000 mL | Freq: Once | OROMUCOSAL | Status: AC
  Administered 2017-07-12: 15 mL via OROMUCOSAL
  Filled 2017-07-11: qty 15

## 2017-07-11 MED ORDER — POTASSIUM CHLORIDE 2 MEQ/ML IV SOLN
80.0000 meq | INTRAVENOUS | Status: DC
  Filled 2017-07-11: qty 40

## 2017-07-11 MED ORDER — DEXMEDETOMIDINE HCL IN NACL 400 MCG/100ML IV SOLN
0.1000 ug/kg/h | INTRAVENOUS | Status: AC
  Administered 2017-07-12: 0.7 ug/kg/h via INTRAVENOUS
  Filled 2017-07-11: qty 100

## 2017-07-11 MED ORDER — DEXTROSE 5 % IV SOLN
750.0000 mg | INTRAVENOUS | Status: DC
  Filled 2017-07-11: qty 750

## 2017-07-11 MED ORDER — TRANEXAMIC ACID (OHS) PUMP PRIME SOLUTION
2.0000 mg/kg | INTRAVENOUS | Status: DC
  Filled 2017-07-11: qty 2.09

## 2017-07-11 MED ORDER — METOPROLOL TARTRATE 12.5 MG HALF TABLET: 12.5000 mg | ORAL_TABLET | Freq: Once | ORAL | Status: DC

## 2017-07-11 MED ORDER — SODIUM CHLORIDE 0.9 % IV SOLN
INTRAVENOUS | Status: AC
  Administered 2017-07-12: 1 [IU]/h via INTRAVENOUS
  Filled 2017-07-11: qty 1

## 2017-07-11 MED ORDER — PLASMA-LYTE 148 IV SOLN
INTRAVENOUS | Status: AC
  Administered 2017-07-12: 07:00:00
  Filled 2017-07-11: qty 2.5

## 2017-07-11 MED ORDER — EPINEPHRINE PF 1 MG/ML IJ SOLN
0.0000 ug/min | INTRAVENOUS | Status: DC
  Filled 2017-07-11: qty 4

## 2017-07-11 MED ORDER — DEXTROSE 5 % IV SOLN
1.5000 g | INTRAVENOUS | Status: AC
  Administered 2017-07-12: .75 g via INTRAVENOUS
  Administered 2017-07-12: 1.5 g via INTRAVENOUS
  Filled 2017-07-11 (×2): qty 1.5

## 2017-07-11 MED ORDER — PHENYLEPHRINE HCL 10 MG/ML IJ SOLN
30.0000 ug/min | INTRAMUSCULAR | Status: AC
  Administered 2017-07-12: 25 ug/min via INTRAVENOUS
  Filled 2017-07-11: qty 2

## 2017-07-11 MED ORDER — TRANEXAMIC ACID 1000 MG/10ML IV SOLN
1.5000 mg/kg/h | INTRAVENOUS | Status: AC
  Administered 2017-07-12: 1.5 mg/kg/h via INTRAVENOUS
  Filled 2017-07-11: qty 25

## 2017-07-11 MED ORDER — VANCOMYCIN HCL 10 G IV SOLR
1500.0000 mg | INTRAVENOUS | Status: AC
  Administered 2017-07-12: 1500 mg via INTRAVENOUS
  Filled 2017-07-11: qty 1500

## 2017-07-11 MED ORDER — NITROGLYCERIN IN D5W 200-5 MCG/ML-% IV SOLN
2.0000 ug/min | INTRAVENOUS | Status: AC
  Administered 2017-07-12: 16.6 ug/min via INTRAVENOUS
  Filled 2017-07-11: qty 250

## 2017-07-11 NOTE — H&P (Signed)
[address]       [place] [address]             [phone]      Cardiothoracic Surgery History and Physical   PCP is Carmelia Roller, Jilda Roche, DO  Referring Provider is [place], Peter M, MD      Chief Complaint  Patient presents with  . Coronary Artery Disease    abnormal exercise cardiac perfusion study, CATH 06/24/17  . Chest Pain   HPI:  The patient is a 61 year old gentleman with a long history of hypertriglyceridemia, hypertension and remote smoking who had no prior cardiac history and is very active exercising vigorously several days per week. He mentioned to his PCP that he had some pain in his left arm with activity recently and had an ECG that was abnormal. He was seen by Dr. Dulce Sellar and had a exercise myocardial perfusion study that was a high risk study with markedly abnormal ECG changes prompting discontinuation of the study. His EF was 48%. There was a medium defect of moderate severity in the apical lateral walls consistent with ischemia. He underwent cardiac cath on 06/24/2017 showing severe 2 vessel CAD with a complex calcified proximal to mid LAD stenosis of 70% with a markedly abnormal FFR of 0.73. The D1 had 99% stenosis at a bifurcation. The RCA was diffusely diseased with calcific plaque with an 85% mid vessel stenosis. LV function was normal with normal LVEDP. The patient says that he has not been doing any exercise since his cath and has had no chest or arm pain. He denies shortness of breath and feels like his stamina is normal. He is somewhat in shock about the diagnosis.      Past Medical History:  Diagnosis Date  . Arthritis   . Asthma   . Benign essential HTN 05/20/2017  . Chest pain 05/20/2017  . Conductive hearing loss of both ears 05/01/2017  . History of gout 12/03/2016  . Hypertriglyceridemia 04/17/2017  . Sleep apnea 05/20/2017  . Vitamin D deficiency 05/20/2017        Past Surgical History:  Procedure Laterality Date  .  CARPAL TUNNEL RELEASE    . INTRAVASCULAR PRESSURE WIRE/FFR STUDY N/A 06/24/2017   Procedure: INTRAVASCULAR PRESSURE WIRE/FFR STUDY; Surgeon: Swaziland, Peter M, MD; Location: [facility] INVASIVE CV LAB; Service: Cardiovascular; Laterality: N/A;  . knee Bilateral   . LEFT HEART CATH AND CORONARY ANGIOGRAPHY N/A 06/24/2017   Procedure: LEFT HEART CATH AND CORONARY ANGIOGRAPHY; Surgeon: Swaziland, Peter M, MD; Location: [facility] INVASIVE CV LAB; Service: Cardiovascular; Laterality: N/A;        Family History  Problem Relation Age of Onset  . Heart attack Father 13   No known heart disease  . Cancer Paternal Grandmother   . Stroke Paternal Grandfather    Social History  Social History        Tobacco Use  . Smoking status: Former Smoker    Packs/day: 1.00    Years: 4.00    Pack years: 4.00    Types: Cigarettes  . Smokeless tobacco: Never Used  . Tobacco comment: SMOKED ONLY IN COLLEGE  Substance Use Topics  . Alcohol use: Yes  . Drug use: No         Current Outpatient Medications  Medication Sig Dispense Refill  . acetaminophen (TYLENOL) 500 MG tablet Take 500 mg by mouth daily as needed for moderate pain.    Marland Kitchen allopurinol (ZYLOPRIM) 100 MG tablet Take  1 tablet (100 mg total) by mouth daily. 30 tablet 6  . aspirin 81 MG chewable tablet Chew 81 mg daily by mouth.    Marland Kitchen atorvastatin (LIPITOR) 40 MG tablet Take 40 mg by mouth daily.     . Cyanocobalamin (B-12) 2500 MCG TABS Take 2,500 mcg by mouth daily.    . diphenhydramine-acetaminophen (TYLENOL PM) 25-500 MG TABS tablet Take 1 tablet by mouth at bedtime as needed (sleep).    . fenofibrate (TRICOR) 145 MG tablet Take 145 mg by mouth daily.     Boris Lown Oil 350 MG CAPS Take 350 mg by mouth daily.    Marland Kitchen lisinopril (PRINIVIL,ZESTRIL) 5 MG tablet Take 5 mg by mouth daily.  2  . LORazepam (ATIVAN) 1 MG tablet Take 1 tablet (1 mg total) as needed by mouth for anxiety. Take 1 tablet by mouth daily as needed for anxiety. 15 tablet 0  . metoprolol tartrate  (LOPRESSOR) 25 MG tablet Take 1 tablet (25 mg total) by mouth 2 (two) times daily. 60 tablet 3  . nitroGLYCERIN (NITROSTAT) 0.4 MG SL tablet Place 1 tablet (0.4 mg total) under the tongue every 5 (five) minutes as needed for chest pain. 25 tablet 12   No current facility-administered medications for this visit.        Allergies  Allergen Reactions  . Sulfur Nausea And Vomiting and Rash   Review of Systems  Constitutional: Positive for activity change. Negative for diaphoresis and fatigue.  HENT: Positive for hearing loss.  Eyes: Negative.  Respiratory: Negative for chest tightness and shortness of breath.  Cardiovascular: Negative for chest pain, palpitations and leg swelling.  Gastrointestinal: Negative.  Endocrine: Negative.  Genitourinary: Negative.  Musculoskeletal: Negative.  Skin: Negative.  Allergic/Immunologic: Negative.  Neurological: Negative for dizziness, syncope, speech difficulty, weakness, numbness and headaches.  Hx of TIA in 2008  Hematological: Negative.  Psychiatric/Behavioral: The patient is nervous/anxious.   BP 115/79 (BP Location: Right Arm, Patient Position: Sitting, Cuff Size: Large)  Pulse 79  Resp 16  Ht 6\' 1"  (1.854 m)  Wt 232 lb (105.2 kg)  SpO2 99% Comment: ON RA  BMI 30.61 kg/m  Physical Exam  Constitutional: He is oriented to person, place, and time. He appears well-developed and well-nourished. No distress.  HENT:  Head: Normocephalic and atraumatic.  Mouth/Throat: Oropharynx is clear and moist.  Eyes: EOM are normal. Pupils are equal, round, and reactive to light.  Neck: Normal range of motion. Neck supple. No JVD present. No thyromegaly present.  Cardiovascular: Normal rate, regular rhythm, normal heart sounds and intact distal pulses.  No murmur heard.  Pulmonary/Chest: Effort normal and breath sounds normal. No respiratory distress.  Abdominal: Soft. Bowel sounds are normal. He exhibits no distension and no mass. There is no  tenderness.  Musculoskeletal: Normal range of motion. He exhibits no edema.  Lymphadenopathy:  He has no cervical adenopathy.  Neurological: He is alert and oriented to person, place, and time. He has normal strength. No cranial nerve deficit or sensory deficit.  Skin: Skin is warm and dry.  Psychiatric: He has a normal mood and affect.   Diagnostic Tests:  Study Highlights  Nuclear stress EF: 48%. No significant wall motion abnormalities noted  Horizontal ST segment depression ST segment depression of 2 mm was noted during stress in the II, III, aVF, V5 and V6 leads.  Defect 1: There is a medium defect of moderate severity present in the apical lateral location.  Findings consistent with ischemia.  This  is a high risk exercise treadmill test secondary to markedly abnormal ST segment depression during exercise and intermediate risk perfusion study demonstrating apical lateral ischemia. Donato SchultzMark Skains, MD   Physicians  Panel Physicians Referring Physician Case Authorizing Physician  Sw[place]ilandJordan, Peter M, MD (Primary)    Procedures  INTRAVASCULAR PRESSURE WIRE/FFR STUDY  LEFT HEART CATH AND CORONARY ANGIOGRAPHY  Conclusion  Mid RCA lesion is 85% stenosed.  Prox LAD lesion is 50% stenosed.  Prox LAD to Mid LAD lesion is 70% stenosed.  1st Diag lesion is 99% stenosed.  The left ventricular systolic function is normal.  LV end diastolic pressure is normal.  The left ventricular ejection fraction is greater than 65% by visual estimate. 1. Severe 2 vessel obstructive CAD  - Complex calcified stenosis in the proximal to mid LAD 70% with markedly abnormal FFR of 0.73  - 99% mid first diagonal at bifurcation point  - 85% mid RCA. Complex lesion with heavy calcification  2. Normal LV function  3. Normal LVEDP.  Plan: will discuss revascularization options. Given heavy calcification and complex lesion morphology I feel the patient would be best served by surgical revascularization with CABG.     Indications  Abnormal nuclear cardiac imaging test [R93.1 (ICD-10-CM)]  Procedural Details/Technique  Technical Details Indication: 61 yo WM with recent chest pain and abnormal nuclear stress test.  Procedural Details: The right wrist was prepped, draped, and anesthetized with 1% lidocaine. Using the modified Seldinger technique, a 6 French slender sheath was introduced into the right radial artery. 3 mg of verapamil was administered through the sheath, weight-based unfractionated heparin was administered intravenously. Standard Judkins catheters were used for selective coronary angiography and left ventriculography. Catheter exchanges were performed over an exchange length guidewire. Following angiography we performed FFR of the LAD. The patient was fully anticoagulated with IV heparin. The LCA was engaged with a XBLAD 3.5 guide. The lesion was crossed with a Comet flow wire. FFR was measured with maximal hyperemia with IV adenosine and was 0.73. There were no immediate procedural complications. A TR band was used for radial hemostasis at the completion of the procedure. The patient was transferred to the post catheterization recovery area for further monitoring.  Contrast: 100 cc   Estimated blood loss <50 mL.  During this procedure the patient was administered the following to achieve and maintain moderate conscious sedation: Versed 3 mg, Fentanyl 75 mcg, while the patient's heart rate, blood pressure, and oxygen saturation were continuously monitored. The period of conscious sedation was 42 minutes, of which I was present face-to-face 100% of this time.  Complications  Complications documented before study signed (06/24/2017 11:13 AM EST)   No complications were associated with this study.  Documented by SwazilandJordan, Peter M, MD - 06/24/2017 11:09 AM EST  Coronary Findings  Diagnostic  Dominance: Right  Left Main  Vessel was injected. Vessel is normal in caliber. The vessel exhibits minimal  luminal irregularities. The vessel is severely calcified.  Left Anterior Descending  Prox LAD lesion 50% stenosed  Prox LAD lesion is 50% stenosed. The lesion is severely calcified.  Prox LAD to Mid LAD lesion 70% stenosed  Prox LAD to Mid LAD lesion is 70% stenosed. The lesion is eccentric. The lesion is moderately calcified. Pressure wire/FFR was performed on the lesion. FFR: 0.73.  First Diagonal Branch  1st Diag lesion 99% stenosed  1st Diag lesion is 99% stenosed.  Left Circumflex  Vessel was injected. Vessel is normal in caliber. The vessel exhibits minimal  luminal irregularities.  Right Coronary Artery  Vessel is large.  Mid RCA lesion 85% stenosed  Mid RCA lesion is 85% stenosed. The lesion is eccentric and irregular. The lesion is severely calcified.  Intervention  No interventions have been documented.  Wall Motion     All segments of the heart are normal.      Left Heart  Left Ventricle The left ventricular size is normal. The left ventricular systolic function is normal. LV end diastolic pressure is normal. The left ventricular ejection fraction is greater than 65% by visual estimate. No regional wall motion abnormalities.  Coronary Diagrams  Diagnostic Diagram     Implants     No implant documentation for this case.  MERGE Images  Link to Procedure Log   Show images for CARDIAC CATHETERIZATION Procedure Log  Hemo Data   Most Recent Value  AO Systolic Pressure 0 mmHg  AO Diastolic Pressure -7 mmHg  AO Mean -5 mmHg  LV Systolic Pressure 117 mmHg  LV Diastolic Pressure 3 mmHg  LV EDP 8 mmHg  Arterial Occlusion Pressure Extended Systolic Pressure 111 mmHg  Arterial Occlusion Pressure Extended Diastolic Pressure 66 mmHg  Arterial Occlusion Pressure Extended Mean Pressure 86 mmHg  Left Ventricular Apex Extended Systolic Pressure 114 mmHg  Left Ventricular Apex Extended Diastolic Pressure 3 mmHg  Left Ventricular Apex Extended EDP Pressure 9 mmHg    Impression:  This gentleman has severe 2-vessel coronary artery disease with a markedly abnormal LAD FFR and a high risk nuclear stress test. I agree that CABG is the best treatment for him. I reviewed his cath films with him and his wife and answered their questions. I discussed the operative procedure with the patient and his wife including alternatives, benefits and risks; including but not limited to bleeding, blood transfusion, infection, stroke, myocardial infarction, graft failure, heart block requiring a permanent pacemaker, organ dysfunction, and death. Debby Budonald Carda understands and agrees to proceed.   Plan:   CABG   Alleen BorneBryan K Zelda Reames, MD  [facility]  [phone]

## 2017-07-12 ENCOUNTER — Encounter (HOSPITAL_COMMUNITY): Payer: Self-pay | Admitting: *Deleted

## 2017-07-12 ENCOUNTER — Other Ambulatory Visit: Payer: Self-pay

## 2017-07-12 ENCOUNTER — Inpatient Hospital Stay (HOSPITAL_COMMUNITY): Payer: BLUE CROSS/BLUE SHIELD | Admitting: Anesthesiology

## 2017-07-12 ENCOUNTER — Inpatient Hospital Stay (HOSPITAL_COMMUNITY)
Admission: RE | Admit: 2017-07-12 | Discharge: 2017-07-17 | DRG: 236 | Disposition: A | Discharge status: Home / Self Care | Payer: BLUE CROSS/BLUE SHIELD | Source: Ambulatory Visit | Attending: Surgery | Admitting: Surgery

## 2017-07-12 ENCOUNTER — Inpatient Hospital Stay (HOSPITAL_COMMUNITY)
Admission: RE | Disposition: A | Discharge status: Home / Self Care | Payer: Self-pay | Source: Ambulatory Visit | Attending: Surgery

## 2017-07-12 ENCOUNTER — Inpatient Hospital Stay (HOSPITAL_COMMUNITY): Payer: BLUE CROSS/BLUE SHIELD

## 2017-07-12 DIAGNOSIS — H900 Conductive hearing loss, bilateral: Secondary | ICD-10-CM | POA: Diagnosis present

## 2017-07-12 DIAGNOSIS — I2510 Atherosclerotic heart disease of native coronary artery without angina pectoris: Secondary | ICD-10-CM | POA: Diagnosis present

## 2017-07-12 DIAGNOSIS — E781 Pure hyperglyceridemia: Secondary | ICD-10-CM | POA: Diagnosis present

## 2017-07-12 DIAGNOSIS — Z823 Family history of stroke: Secondary | ICD-10-CM | POA: Diagnosis not present

## 2017-07-12 DIAGNOSIS — Z7982 Long term (current) use of aspirin: Secondary | ICD-10-CM | POA: Diagnosis not present

## 2017-07-12 DIAGNOSIS — E669 Obesity, unspecified: Secondary | ICD-10-CM | POA: Diagnosis present

## 2017-07-12 DIAGNOSIS — Z09 Encounter for follow-up examination after completed treatment for conditions other than malignant neoplasm: Secondary | ICD-10-CM

## 2017-07-12 DIAGNOSIS — Z87891 Personal history of nicotine dependence: Secondary | ICD-10-CM | POA: Diagnosis not present

## 2017-07-12 DIAGNOSIS — J45909 Unspecified asthma, uncomplicated: Secondary | ICD-10-CM | POA: Diagnosis present

## 2017-07-12 DIAGNOSIS — I10 Essential (primary) hypertension: Secondary | ICD-10-CM | POA: Diagnosis present

## 2017-07-12 DIAGNOSIS — Z882 Allergy status to sulfonamides status: Secondary | ICD-10-CM | POA: Diagnosis not present

## 2017-07-12 DIAGNOSIS — G4730 Sleep apnea, unspecified: Secondary | ICD-10-CM | POA: Diagnosis present

## 2017-07-12 DIAGNOSIS — D62 Acute posthemorrhagic anemia: Secondary | ICD-10-CM | POA: Diagnosis not present

## 2017-07-12 DIAGNOSIS — E559 Vitamin D deficiency, unspecified: Secondary | ICD-10-CM | POA: Diagnosis present

## 2017-07-12 DIAGNOSIS — Z951 Presence of aortocoronary bypass graft: Secondary | ICD-10-CM

## 2017-07-12 DIAGNOSIS — M109 Gout, unspecified: Secondary | ICD-10-CM | POA: Diagnosis present

## 2017-07-12 DIAGNOSIS — Z8249 Family history of ischemic heart disease and other diseases of the circulatory system: Secondary | ICD-10-CM

## 2017-07-12 DIAGNOSIS — Z79899 Other long term (current) drug therapy: Secondary | ICD-10-CM | POA: Diagnosis not present

## 2017-07-12 DIAGNOSIS — E8770 Fluid overload, unspecified: Secondary | ICD-10-CM | POA: Diagnosis not present

## 2017-07-12 DIAGNOSIS — Z6830 Body mass index (BMI) 30.0-30.9, adult: Secondary | ICD-10-CM

## 2017-07-12 HISTORY — PX: TEE WITHOUT CARDIOVERSION: SHX5443

## 2017-07-12 HISTORY — PX: CORONARY ARTERY BYPASS GRAFT: SHX141

## 2017-07-12 LAB — POCT I-STAT, CHEM 8
BUN: 24 mg/dL — ABNORMAL HIGH (ref 6–20)
BUN: 19 mg/dL (ref 6–20)
BUN: 24 mg/dL — ABNORMAL HIGH (ref 6–20)
BUN: 21 mg/dL — ABNORMAL HIGH (ref 6–20)
BUN: 21 mg/dL — AB (ref 6–20)
BUN: 18 mg/dL (ref 6–20)
CALCIUM ION: 1.27 mmol/L (ref 1.15–1.40)
CALCIUM ION: 0.95 mmol/L — AB (ref 1.15–1.40)
CHLORIDE: 107 mmol/L (ref 101–111)
CHLORIDE: 103 mmol/L (ref 101–111)
CHLORIDE: 107 mmol/L (ref 101–111)
CHLORIDE: 105 mmol/L (ref 101–111)
CREATININE: 0.9 mg/dL (ref 0.61–1.24)
Calcium, Ion: 1.23 mmol/L (ref 1.15–1.40)
Calcium, Ion: 1.09 mmol/L — ABNORMAL LOW (ref 1.15–1.40)
Calcium, Ion: 1.39 mmol/L (ref 1.15–1.40)
Calcium, Ion: 1.2 mmol/L (ref 1.15–1.40)
Chloride: 104 mmol/L (ref 101–111)
Chloride: 106 mmol/L (ref 101–111)
Creatinine, Ser: 1 mg/dL (ref 0.61–1.24)
Creatinine, Ser: 0.7 mg/dL (ref 0.61–1.24)
Creatinine, Ser: 1.1 mg/dL (ref 0.61–1.24)
Creatinine, Ser: 1 mg/dL (ref 0.61–1.24)
Creatinine, Ser: 1 mg/dL (ref 0.61–1.24)
GLUCOSE: 111 mg/dL — AB (ref 65–99)
GLUCOSE: 113 mg/dL — AB (ref 65–99)
Glucose, Bld: 96 mg/dL (ref 65–99)
Glucose, Bld: 134 mg/dL — ABNORMAL HIGH (ref 65–99)
Glucose, Bld: 132 mg/dL — ABNORMAL HIGH (ref 65–99)
Glucose, Bld: 130 mg/dL — ABNORMAL HIGH (ref 65–99)
HCT: 35 % — ABNORMAL LOW (ref 39.0–52.0)
HCT: 27 % — ABNORMAL LOW (ref 39.0–52.0)
HEMATOCRIT: 34 % — AB (ref 39.0–52.0)
HEMATOCRIT: 29 % — AB (ref 39.0–52.0)
HEMATOCRIT: 27 % — AB (ref 39.0–52.0)
HEMATOCRIT: 29 % — AB (ref 39.0–52.0)
HEMOGLOBIN: 11.9 g/dL — AB (ref 13.0–17.0)
HEMOGLOBIN: 9.9 g/dL — AB (ref 13.0–17.0)
Hemoglobin: 11.6 g/dL — ABNORMAL LOW (ref 13.0–17.0)
Hemoglobin: 9.2 g/dL — ABNORMAL LOW (ref 13.0–17.0)
Hemoglobin: 9.9 g/dL — ABNORMAL LOW (ref 13.0–17.0)
Hemoglobin: 9.2 g/dL — ABNORMAL LOW (ref 13.0–17.0)
POTASSIUM: 5.4 mmol/L — AB (ref 3.5–5.1)
POTASSIUM: 5.7 mmol/L — AB (ref 3.5–5.1)
POTASSIUM: 6.5 mmol/L — AB (ref 3.5–5.1)
Potassium: 5.1 mmol/L (ref 3.5–5.1)
Potassium: 5.4 mmol/L — ABNORMAL HIGH (ref 3.5–5.1)
Potassium: 4.7 mmol/L (ref 3.5–5.1)
SODIUM: 139 mmol/L (ref 135–145)
SODIUM: 139 mmol/L (ref 135–145)
SODIUM: 134 mmol/L — AB (ref 135–145)
SODIUM: 136 mmol/L (ref 135–145)
SODIUM: 138 mmol/L (ref 135–145)
Sodium: 138 mmol/L (ref 135–145)
TCO2: 25 mmol/L (ref 22–32)
TCO2: 26 mmol/L (ref 22–32)
TCO2: 26 mmol/L (ref 22–32)
TCO2: 24 mmol/L (ref 22–32)
TCO2: 24 mmol/L (ref 22–32)
TCO2: 22 mmol/L (ref 22–32)

## 2017-07-12 LAB — HEMOGLOBIN AND HEMATOCRIT, BLOOD
HCT: 31.2 % — ABNORMAL LOW (ref 39.0–52.0)
HEMOGLOBIN: 10.2 g/dL — AB (ref 13.0–17.0)

## 2017-07-12 LAB — POCT I-STAT 3, ART BLOOD GAS (G3+)
ACID-BASE DEFICIT: 5 mmol/L — AB (ref 0.0–2.0)
Acid-base deficit: 2 mmol/L (ref 0.0–2.0)
Acid-base deficit: 1 mmol/L (ref 0.0–2.0)
Acid-base deficit: 3 mmol/L — ABNORMAL HIGH (ref 0.0–2.0)
Acid-base deficit: 4 mmol/L — ABNORMAL HIGH (ref 0.0–2.0)
Acid-base deficit: 5 mmol/L — ABNORMAL HIGH (ref 0.0–2.0)
BICARBONATE: 23.1 mmol/L (ref 20.0–28.0)
BICARBONATE: 20.5 mmol/L (ref 20.0–28.0)
Bicarbonate: 24.1 mmol/L (ref 20.0–28.0)
Bicarbonate: 24.4 mmol/L (ref 20.0–28.0)
Bicarbonate: 23.1 mmol/L (ref 20.0–28.0)
Bicarbonate: 20 mmol/L (ref 20.0–28.0)
O2 Saturation: 100 %
O2 Saturation: 100 %
O2 Saturation: 100 %
O2 Saturation: 98 %
O2 Saturation: 97 %
O2 Saturation: 99 %
PCO2 ART: 44.6 mmHg (ref 32.0–48.0)
PCO2 ART: 45.9 mmHg (ref 32.0–48.0)
PH ART: 7.34 — AB (ref 7.350–7.450)
PH ART: 7.391 (ref 7.350–7.450)
PH ART: 7.309 — AB (ref 7.350–7.450)
PH ART: 7.3 — AB (ref 7.350–7.450)
PO2 ART: 235 mmHg — AB (ref 83.0–108.0)
PO2 ART: 111 mmHg — AB (ref 83.0–108.0)
Patient temperature: 35.8
Patient temperature: 37.1
TCO2: 25 mmol/L (ref 22–32)
TCO2: 26 mmol/L (ref 22–32)
TCO2: 24 mmol/L (ref 22–32)
TCO2: 25 mmol/L (ref 22–32)
TCO2: 22 mmol/L (ref 22–32)
TCO2: 21 mmol/L — ABNORMAL LOW (ref 22–32)
pCO2 arterial: 40.2 mmHg (ref 32.0–48.0)
pCO2 arterial: 46.3 mmHg (ref 32.0–48.0)
pCO2 arterial: 38.8 mmHg (ref 32.0–48.0)
pCO2 arterial: 35 mmHg (ref 32.0–48.0)
pH, Arterial: 7.328 — ABNORMAL LOW (ref 7.350–7.450)
pH, Arterial: 7.366 (ref 7.350–7.450)
pO2, Arterial: 320 mmHg — ABNORMAL HIGH (ref 83.0–108.0)
pO2, Arterial: 355 mmHg — ABNORMAL HIGH (ref 83.0–108.0)
pO2, Arterial: 101 mmHg (ref 83.0–108.0)
pO2, Arterial: 127 mmHg — ABNORMAL HIGH (ref 83.0–108.0)

## 2017-07-12 LAB — CBC
HCT: 32.2 % — ABNORMAL LOW (ref 39.0–52.0)
HEMATOCRIT: 30.4 % — AB (ref 39.0–52.0)
HEMOGLOBIN: 10.1 g/dL — AB (ref 13.0–17.0)
HEMOGLOBIN: 10.3 g/dL — AB (ref 13.0–17.0)
MCH: 32.1 pg (ref 26.0–34.0)
MCH: 30.7 pg (ref 26.0–34.0)
MCHC: 33.2 g/dL (ref 30.0–36.0)
MCHC: 32 g/dL (ref 30.0–36.0)
MCV: 96.5 fL (ref 78.0–100.0)
MCV: 96.1 fL (ref 78.0–100.0)
Platelets: 156 10*3/uL (ref 150–400)
Platelets: 154 10*3/uL (ref 150–400)
RBC: 3.15 MIL/uL — ABNORMAL LOW (ref 4.22–5.81)
RBC: 3.35 MIL/uL — AB (ref 4.22–5.81)
RDW: 12.6 % (ref 11.5–15.5)
RDW: 12.7 % (ref 11.5–15.5)
WBC: 12.3 10*3/uL — AB (ref 4.0–10.5)
WBC: 12.2 10*3/uL — ABNORMAL HIGH (ref 4.0–10.5)

## 2017-07-12 LAB — POCT I-STAT 4, (NA,K, GLUC, HGB,HCT)
Glucose, Bld: 106 mg/dL — ABNORMAL HIGH (ref 65–99)
HCT: 29 % — ABNORMAL LOW (ref 39.0–52.0)
HEMOGLOBIN: 9.9 g/dL — AB (ref 13.0–17.0)
Potassium: 4.6 mmol/L (ref 3.5–5.1)
SODIUM: 139 mmol/L (ref 135–145)

## 2017-07-12 LAB — MAGNESIUM: Magnesium: 2.7 mg/dL — ABNORMAL HIGH (ref 1.7–2.4)

## 2017-07-12 LAB — PROTIME-INR
INR: 1.22
Prothrombin Time: 15.3 seconds — ABNORMAL HIGH (ref 11.4–15.2)

## 2017-07-12 LAB — PLATELET COUNT: PLATELETS: 175 10*3/uL (ref 150–400)

## 2017-07-12 LAB — CREATININE, SERUM
Creatinine, Ser: 1.04 mg/dL (ref 0.61–1.24)
GFR calc non Af Amer: >60 mL/min (ref >60)

## 2017-07-12 LAB — GLUCOSE, CAPILLARY
GLUCOSE-CAPILLARY: 110 mg/dL — AB (ref 65–99)
GLUCOSE-CAPILLARY: 103 mg/dL — AB (ref 65–99)
GLUCOSE-CAPILLARY: 90 mg/dL (ref 65–99)
Glucose-Capillary: 121 mg/dL — ABNORMAL HIGH (ref 65–99)

## 2017-07-12 LAB — APTT: APTT: 27 s (ref 24–36)

## 2017-07-12 SURGERY — CORONARY ARTERY BYPASS GRAFTING (CABG)
Anesthesia: General | Site: Chest

## 2017-07-12 MED ORDER — ALBUMIN HUMAN 5 % IV SOLN
250.0000 mL | INTRAVENOUS | Status: DC | PRN
  Administered 2017-07-12 – 2017-07-13 (×3): 250 mL via INTRAVENOUS
  Filled 2017-07-12: qty 250

## 2017-07-12 MED ORDER — METOPROLOL TARTRATE 12.5 MG HALF TABLET
12.5000 mg | ORAL_TABLET | Freq: Two times a day (BID) | ORAL | Status: DC
  Administered 2017-07-13 – 2017-07-15 (×4): 12.5 mg via ORAL
  Filled 2017-07-12 (×5): qty 1

## 2017-07-12 MED ORDER — PHENYLEPHRINE HCL 10 MG/ML IJ SOLN
INTRAVENOUS | Status: DC | PRN
  Administered 2017-07-12: 25 ug/min via INTRAVENOUS

## 2017-07-12 MED ORDER — SODIUM CHLORIDE 0.9 % IJ SOLN
OROMUCOSAL | Status: DC | PRN
  Administered 2017-07-12 (×3): via TOPICAL

## 2017-07-12 MED ORDER — DEXTROSE 5 % IV SOLN
1.5000 g | Freq: Two times a day (BID) | INTRAVENOUS | Status: AC
  Administered 2017-07-13 – 2017-07-14 (×4): 1.5 g via INTRAVENOUS
  Filled 2017-07-12 (×4): qty 1.5

## 2017-07-12 MED ORDER — LACTATED RINGERS IV SOLN
500.0000 mL | Freq: Once | INTRAVENOUS | Status: AC | PRN
  Administered 2017-07-12: 500 mL via INTRAVENOUS

## 2017-07-12 MED ORDER — METOPROLOL TARTRATE 5 MG/5ML IV SOLN: 2.5000 mg | INTRAVENOUS | Status: DC | PRN

## 2017-07-12 MED ORDER — LEVALBUTEROL HCL 0.63 MG/3ML IN NEBU: 0.6300 mg | INHALATION_SOLUTION | Freq: Four times a day (QID) | RESPIRATORY_TRACT | Status: DC | PRN

## 2017-07-12 MED ORDER — CHLORHEXIDINE GLUCONATE 4 % EX LIQD: 30.0000 mL | CUTANEOUS | Status: DC

## 2017-07-12 MED ORDER — SODIUM CHLORIDE 0.9 % IV SOLN
0.0000 ug/min | INTRAVENOUS | Status: DC
  Administered 2017-07-13: 40 ug/min via INTRAVENOUS
  Filled 2017-07-12: qty 2

## 2017-07-12 MED ORDER — LACTATED RINGERS IV SOLN
INTRAVENOUS | Status: DC | PRN
  Administered 2017-07-12 (×4): via INTRAVENOUS

## 2017-07-12 MED ORDER — SODIUM CHLORIDE 0.45 % IV SOLN
INTRAVENOUS | Status: DC | PRN
  Administered 2017-07-12: 13:00:00 via INTRAVENOUS

## 2017-07-12 MED ORDER — LACTATED RINGERS IV SOLN: INTRAVENOUS | Status: DC

## 2017-07-12 MED ORDER — OXYCODONE HCL 5 MG PO TABS
5.0000 mg | ORAL_TABLET | ORAL | Status: DC | PRN
  Administered 2017-07-13: 5 mg via ORAL
  Administered 2017-07-13 – 2017-07-15 (×6): 10 mg via ORAL
  Filled 2017-07-12 (×2): qty 2
  Filled 2017-07-12: qty 1
  Filled 2017-07-12 (×4): qty 2

## 2017-07-12 MED ORDER — INSULIN REGULAR BOLUS VIA INFUSION
0.0000 [IU] | Freq: Three times a day (TID) | INTRAVENOUS | Status: DC
  Filled 2017-07-12: qty 10

## 2017-07-12 MED ORDER — SUCCINYLCHOLINE CHLORIDE 20 MG/ML IJ SOLN
INTRAMUSCULAR | Status: DC | PRN
  Administered 2017-07-12: 120 mg via INTRAVENOUS

## 2017-07-12 MED ORDER — TRAMADOL HCL 50 MG PO TABS
50.0000 mg | ORAL_TABLET | ORAL | Status: DC | PRN
  Administered 2017-07-13 – 2017-07-15 (×2): 100 mg via ORAL
  Filled 2017-07-12 (×2): qty 2

## 2017-07-12 MED ORDER — DEXMEDETOMIDINE HCL IN NACL 200 MCG/50ML IV SOLN
INTRAVENOUS | Status: AC
  Filled 2017-07-12: qty 50

## 2017-07-12 MED ORDER — PROTAMINE SULFATE 10 MG/ML IV SOLN
INTRAVENOUS | Status: AC
  Filled 2017-07-12: qty 50

## 2017-07-12 MED ORDER — INSULIN ASPART 100 UNIT/ML ~~LOC~~ SOLN
0.0000 [IU] | SUBCUTANEOUS | Status: DC
  Administered 2017-07-12 – 2017-07-13 (×4): 2 [IU] via SUBCUTANEOUS

## 2017-07-12 MED ORDER — SODIUM CHLORIDE 0.9% FLUSH: 3.0000 mL | INTRAVENOUS | Status: DC | PRN

## 2017-07-12 MED ORDER — ACETAMINOPHEN 650 MG RE SUPP: 650.0000 mg | Freq: Once | RECTAL | Status: AC

## 2017-07-12 MED ORDER — FENTANYL CITRATE (PF) 250 MCG/5ML IJ SOLN
INTRAMUSCULAR | Status: AC
  Filled 2017-07-12: qty 10

## 2017-07-12 MED ORDER — INSULIN REGULAR HUMAN 100 UNIT/ML IJ SOLN
INTRAMUSCULAR | Status: DC
  Filled 2017-07-12: qty 1

## 2017-07-12 MED ORDER — BISACODYL 5 MG PO TBEC
10.0000 mg | DELAYED_RELEASE_TABLET | Freq: Every day | ORAL | Status: DC
  Administered 2017-07-13 – 2017-07-15 (×3): 10 mg via ORAL
  Filled 2017-07-12 (×3): qty 2

## 2017-07-12 MED ORDER — ROCURONIUM BROMIDE 100 MG/10ML IV SOLN
INTRAVENOUS | Status: DC | PRN
  Administered 2017-07-12: 50 mg via INTRAVENOUS
  Administered 2017-07-12: 30 mg via INTRAVENOUS
  Administered 2017-07-12: 50 mg via INTRAVENOUS
  Administered 2017-07-12: 100 mg via INTRAVENOUS

## 2017-07-12 MED ORDER — PROPOFOL 10 MG/ML IV BOLUS
INTRAVENOUS | Status: DC | PRN
  Administered 2017-07-12: 50 mg via INTRAVENOUS
  Administered 2017-07-12: 100 mg via INTRAVENOUS

## 2017-07-12 MED ORDER — MIDAZOLAM HCL 2 MG/2ML IJ SOLN: 2.0000 mg | INTRAMUSCULAR | Status: DC | PRN

## 2017-07-12 MED ORDER — 0.9 % SODIUM CHLORIDE (POUR BTL) OPTIME
TOPICAL | Status: DC | PRN
  Administered 2017-07-12: 6000 mL

## 2017-07-12 MED ORDER — FENTANYL CITRATE (PF) 250 MCG/5ML IJ SOLN
INTRAMUSCULAR | Status: DC | PRN
  Administered 2017-07-12: 200 ug via INTRAVENOUS
  Administered 2017-07-12 (×5): 250 ug via INTRAVENOUS
  Administered 2017-07-12: 50 ug via INTRAVENOUS

## 2017-07-12 MED ORDER — ATORVASTATIN CALCIUM 40 MG PO TABS
40.0000 mg | ORAL_TABLET | Freq: Every day | ORAL | Status: DC
  Administered 2017-07-13 – 2017-07-16 (×4): 40 mg via ORAL
  Filled 2017-07-12 (×4): qty 1

## 2017-07-12 MED ORDER — FENTANYL CITRATE (PF) 250 MCG/5ML IJ SOLN
INTRAMUSCULAR | Status: AC
  Filled 2017-07-12: qty 20

## 2017-07-12 MED ORDER — SODIUM CHLORIDE 0.9% FLUSH
3.0000 mL | Freq: Two times a day (BID) | INTRAVENOUS | Status: DC
  Administered 2017-07-13: 3 mL via INTRAVENOUS
  Administered 2017-07-13 – 2017-07-14 (×2): 10 mL via INTRAVENOUS
  Administered 2017-07-14 – 2017-07-15 (×2): 3 mL via INTRAVENOUS

## 2017-07-12 MED ORDER — DOCUSATE SODIUM 100 MG PO CAPS
200.0000 mg | ORAL_CAPSULE | Freq: Every day | ORAL | Status: DC
  Administered 2017-07-13 – 2017-07-15 (×3): 200 mg via ORAL
  Filled 2017-07-12 (×5): qty 2

## 2017-07-12 MED ORDER — THROMBIN (RECOMBINANT) 5000 UNITS EX SOLR
CUTANEOUS | Status: AC
  Filled 2017-07-12: qty 15000

## 2017-07-12 MED ORDER — PANTOPRAZOLE SODIUM 40 MG PO TBEC
40.0000 mg | DELAYED_RELEASE_TABLET | Freq: Every day | ORAL | Status: DC
  Administered 2017-07-14 – 2017-07-15 (×2): 40 mg via ORAL
  Filled 2017-07-12 (×2): qty 1

## 2017-07-12 MED ORDER — HEPARIN SODIUM (PORCINE) 1000 UNIT/ML IJ SOLN
INTRAMUSCULAR | Status: DC | PRN
  Administered 2017-07-12: 35000 [IU] via INTRAVENOUS

## 2017-07-12 MED ORDER — MIDAZOLAM HCL 5 MG/ML IJ SOLN
INTRAMUSCULAR | Status: DC | PRN
  Administered 2017-07-12: 4 mg via INTRAVENOUS
  Administered 2017-07-12 (×3): 2 mg via INTRAVENOUS

## 2017-07-12 MED ORDER — CALCIUM CHLORIDE 10 % IV SOLN
INTRAVENOUS | Status: DC | PRN
  Administered 2017-07-12: 200 mg via INTRAVENOUS
  Administered 2017-07-12: 100 mg via INTRAVENOUS

## 2017-07-12 MED ORDER — VANCOMYCIN HCL IN DEXTROSE 1-5 GM/200ML-% IV SOLN
1000.0000 mg | Freq: Once | INTRAVENOUS | Status: AC
Start: 2017-07-12 — End: 2017-07-12
  Administered 2017-07-12: 1000 mg via INTRAVENOUS
  Filled 2017-07-12: qty 200

## 2017-07-12 MED ORDER — PROTAMINE SULFATE 10 MG/ML IV SOLN
INTRAVENOUS | Status: DC | PRN
  Administered 2017-07-12 (×3): 50 mg via INTRAVENOUS
  Administered 2017-07-12: 10 mg via INTRAVENOUS
  Administered 2017-07-12 (×2): 50 mg via INTRAVENOUS
  Administered 2017-07-12: 40 mg via INTRAVENOUS

## 2017-07-12 MED ORDER — PHENYLEPHRINE HCL 10 MG/ML IJ SOLN
INTRAMUSCULAR | Status: DC | PRN
  Administered 2017-07-12: 80 ug via INTRAVENOUS

## 2017-07-12 MED ORDER — ASPIRIN 81 MG PO CHEW
324.0000 mg | CHEWABLE_TABLET | Freq: Every day | ORAL | Status: DC
  Administered 2017-07-14: 324 mg
  Filled 2017-07-12: qty 4

## 2017-07-12 MED ORDER — HEPARIN SODIUM (PORCINE) 1000 UNIT/ML IJ SOLN
INTRAMUSCULAR | Status: AC
  Filled 2017-07-12: qty 2

## 2017-07-12 MED ORDER — CHLORHEXIDINE GLUCONATE 0.12 % MT SOLN: 15.0000 mL | OROMUCOSAL | Status: AC

## 2017-07-12 MED ORDER — ALBUMIN HUMAN 5 % IV SOLN
INTRAVENOUS | Status: DC | PRN
  Administered 2017-07-12 (×2): via INTRAVENOUS

## 2017-07-12 MED ORDER — MAGNESIUM SULFATE 4 GM/100ML IV SOLN
4.0000 g | Freq: Once | INTRAVENOUS | Status: AC
  Administered 2017-07-12: 4 g via INTRAVENOUS
  Filled 2017-07-12: qty 100

## 2017-07-12 MED ORDER — BISACODYL 10 MG RE SUPP: 10.0000 mg | Freq: Every day | RECTAL | Status: DC

## 2017-07-12 MED ORDER — FAMOTIDINE IN NACL 20-0.9 MG/50ML-% IV SOLN
20.0000 mg | Freq: Two times a day (BID) | INTRAVENOUS | Status: AC
  Administered 2017-07-12 (×2): 20 mg via INTRAVENOUS
  Filled 2017-07-12: qty 50

## 2017-07-12 MED ORDER — ACETAMINOPHEN 500 MG PO TABS
1000.0000 mg | ORAL_TABLET | Freq: Four times a day (QID) | ORAL | Status: DC
  Administered 2017-07-12 – 2017-07-15 (×10): 1000 mg via ORAL
  Filled 2017-07-12 (×10): qty 2

## 2017-07-12 MED ORDER — MORPHINE SULFATE (PF) 4 MG/ML IV SOLN
1.0000 mg | INTRAVENOUS | Status: AC | PRN
  Administered 2017-07-12: 1 mg via INTRAVENOUS
  Filled 2017-07-12: qty 1

## 2017-07-12 MED ORDER — POTASSIUM CHLORIDE 10 MEQ/50ML IV SOLN: 10.0000 meq | INTRAVENOUS | Status: AC

## 2017-07-12 MED ORDER — CHLORHEXIDINE GLUCONATE 0.12% ORAL RINSE (MEDLINE KIT)
15.0000 mL | Freq: Two times a day (BID) | OROMUCOSAL | Status: DC
  Administered 2017-07-12 – 2017-07-13 (×3): 15 mL via OROMUCOSAL

## 2017-07-12 MED ORDER — MORPHINE SULFATE (PF) 2 MG/ML IV SOLN: 1.0000 mg | INTRAVENOUS | Status: DC | PRN

## 2017-07-12 MED ORDER — SODIUM CHLORIDE 0.9 % IV SOLN: INTRAVENOUS | Status: DC

## 2017-07-12 MED ORDER — ACETAMINOPHEN 160 MG/5ML PO SOLN
1000.0000 mg | Freq: Four times a day (QID) | ORAL | Status: DC
Start: 2017-07-13 — End: 2017-07-15

## 2017-07-12 MED ORDER — ORAL CARE MOUTH RINSE
15.0000 mL | Freq: Four times a day (QID) | OROMUCOSAL | Status: DC
  Administered 2017-07-12 – 2017-07-13 (×3): 15 mL via OROMUCOSAL

## 2017-07-12 MED ORDER — MIDAZOLAM HCL 10 MG/2ML IJ SOLN
INTRAMUSCULAR | Status: AC
  Filled 2017-07-12: qty 2

## 2017-07-12 MED ORDER — PROTAMINE SULFATE 10 MG/ML IV SOLN
INTRAVENOUS | Status: AC
  Filled 2017-07-12: qty 5

## 2017-07-12 MED ORDER — MORPHINE SULFATE (PF) 2 MG/ML IV SOLN: 2.0000 mg | INTRAVENOUS | Status: DC | PRN

## 2017-07-12 MED ORDER — SODIUM CHLORIDE 0.9 % IV SOLN: 250.0000 mL | INTRAVENOUS | Status: DC

## 2017-07-12 MED ORDER — HEMOSTATIC AGENTS (NO CHARGE) OPTIME
TOPICAL | Status: DC | PRN
  Administered 2017-07-12: 1 via TOPICAL

## 2017-07-12 MED ORDER — SODIUM CHLORIDE 0.9 % IV SOLN
0.0000 ug/kg/h | INTRAVENOUS | Status: DC
  Administered 2017-07-12: 0.1 ug/kg/h via INTRAVENOUS
  Filled 2017-07-12 (×2): qty 2

## 2017-07-12 MED ORDER — METOPROLOL TARTRATE 25 MG/10 ML ORAL SUSPENSION: 12.5000 mg | Freq: Two times a day (BID) | ORAL | Status: DC

## 2017-07-12 MED ORDER — MORPHINE SULFATE (PF) 4 MG/ML IV SOLN
2.0000 mg | INTRAVENOUS | Status: DC | PRN
  Administered 2017-07-12: 2 mg via INTRAVENOUS
  Administered 2017-07-12 – 2017-07-13 (×2): 4 mg via INTRAVENOUS
  Filled 2017-07-12 (×3): qty 1

## 2017-07-12 MED ORDER — HEPARIN SODIUM (PORCINE) 1000 UNIT/ML IJ SOLN
INTRAMUSCULAR | Status: AC
  Filled 2017-07-12: qty 1

## 2017-07-12 MED ORDER — ACETAMINOPHEN 160 MG/5ML PO SOLN
650.0000 mg | Freq: Once | ORAL | Status: AC
  Administered 2017-07-12: 650 mg

## 2017-07-12 MED ORDER — PROPOFOL 10 MG/ML IV BOLUS
INTRAVENOUS | Status: AC
  Filled 2017-07-12: qty 20

## 2017-07-12 MED ORDER — NITROGLYCERIN IN D5W 200-5 MCG/ML-% IV SOLN: 0.0000 ug/min | INTRAVENOUS | Status: DC

## 2017-07-12 MED ORDER — THROMBIN (RECOMBINANT) 5000 UNITS EX SOLR
CUTANEOUS | Status: DC | PRN
  Administered 2017-07-12 (×3): 5000 [IU] via TOPICAL

## 2017-07-12 MED ORDER — ASPIRIN EC 325 MG PO TBEC
325.0000 mg | DELAYED_RELEASE_TABLET | Freq: Every day | ORAL | Status: DC
  Administered 2017-07-13 – 2017-07-15 (×2): 325 mg via ORAL
  Filled 2017-07-12 (×2): qty 1

## 2017-07-12 MED ORDER — ONDANSETRON HCL 4 MG/2ML IJ SOLN
4.0000 mg | Freq: Four times a day (QID) | INTRAMUSCULAR | Status: DC | PRN
  Administered 2017-07-12: 4 mg via INTRAVENOUS
  Filled 2017-07-12: qty 2

## 2017-07-12 MED FILL — Lidocaine HCl IV Inj 20 MG/ML: INTRAVENOUS | Qty: 5 | Status: AC

## 2017-07-12 MED FILL — Mannitol IV Soln 20%: INTRAVENOUS | Qty: 500 | Status: AC

## 2017-07-12 MED FILL — Sodium Chloride IV Soln 0.9%: INTRAVENOUS | Qty: 2000 | Status: AC

## 2017-07-12 MED FILL — Electrolyte-R (PH 7.4) Solution: INTRAVENOUS | Qty: 4000 | Status: AC

## 2017-07-12 MED FILL — Sodium Bicarbonate IV Soln 8.4%: INTRAVENOUS | Qty: 50 | Status: AC

## 2017-07-12 MED FILL — Heparin Sodium (Porcine) Inj 1000 Unit/ML: INTRAMUSCULAR | Qty: 10 | Status: AC

## 2017-07-12 SURGICAL SUPPLY — 101 items
BAG DECANTER FOR FLEXI CONT (MISCELLANEOUS) ×4 IMPLANT
BANDAGE ACE 4X5 VEL STRL LF (GAUZE/BANDAGES/DRESSINGS) ×4 IMPLANT
BANDAGE ACE 6X5 VEL STRL LF (GAUZE/BANDAGES/DRESSINGS) ×4 IMPLANT
BASKET HEART  (ORDER IN 25'S) (MISCELLANEOUS) ×1
BASKET HEART (ORDER IN 25'S) (MISCELLANEOUS) ×1
BASKET HEART (ORDER IN 25S) (MISCELLANEOUS) ×2 IMPLANT
BLADE STERNUM SYSTEM 6 (BLADE) ×4 IMPLANT
BLADE SURG 11 STRL SS (BLADE) ×4 IMPLANT
BNDG GAUZE ELAST 4 BULKY (GAUZE/BANDAGES/DRESSINGS) ×4 IMPLANT
CANISTER SUCT 3000ML PPV (MISCELLANEOUS) ×4 IMPLANT
CATH ROBINSON RED A/P 18FR (CATHETERS) ×8 IMPLANT
CATH THORACIC 28FR (CATHETERS) ×4 IMPLANT
CATH THORACIC 36FR (CATHETERS) ×4 IMPLANT
CATH THORACIC 36FR RT ANG (CATHETERS) ×4 IMPLANT
CLIP VESOCCLUDE MED 24/CT (CLIP) IMPLANT
CLIP VESOCCLUDE SM WIDE 24/CT (CLIP) ×8 IMPLANT
CRADLE DONUT ADULT HEAD (MISCELLANEOUS) ×4 IMPLANT
DRAPE CARDIOVASCULAR INCISE (DRAPES) ×2
DRAPE SLUSH/WARMER DISC (DRAPES) ×4 IMPLANT
DRAPE SRG 135X102X78XABS (DRAPES) ×2 IMPLANT
DRSG COVADERM 4X14 (GAUZE/BANDAGES/DRESSINGS) ×4 IMPLANT
ELECT CAUTERY BLADE 6.4 (BLADE) ×4 IMPLANT
ELECT REM PT RETURN 9FT ADLT (ELECTROSURGICAL) ×8
ELECTRODE REM PT RTRN 9FT ADLT (ELECTROSURGICAL) ×4 IMPLANT
FELT TEFLON 1X6 (MISCELLANEOUS) ×8 IMPLANT
GAUZE SPONGE 4X4 12PLY STRL (GAUZE/BANDAGES/DRESSINGS) ×8 IMPLANT
GAUZE SPONGE 4X4 12PLY STRL LF (GAUZE/BANDAGES/DRESSINGS) ×8 IMPLANT
GLOVE BIO SURGEON STRL SZ 6 (GLOVE) IMPLANT
GLOVE BIO SURGEON STRL SZ 6.5 (GLOVE) ×18 IMPLANT
GLOVE BIO SURGEON STRL SZ7 (GLOVE) IMPLANT
GLOVE BIO SURGEON STRL SZ7.5 (GLOVE) IMPLANT
GLOVE BIO SURGEONS STRL SZ 6.5 (GLOVE) ×6
GLOVE BIOGEL PI IND STRL 6 (GLOVE) IMPLANT
GLOVE BIOGEL PI IND STRL 6.5 (GLOVE) IMPLANT
GLOVE BIOGEL PI IND STRL 7.0 (GLOVE) IMPLANT
GLOVE BIOGEL PI INDICATOR 6 (GLOVE)
GLOVE BIOGEL PI INDICATOR 6.5 (GLOVE)
GLOVE BIOGEL PI INDICATOR 7.0 (GLOVE)
GLOVE EUDERMIC 7 POWDERFREE (GLOVE) ×8 IMPLANT
GLOVE ORTHO TXT STRL SZ7.5 (GLOVE) IMPLANT
GOWN STRL REUS W/ TWL LRG LVL3 (GOWN DISPOSABLE) ×8 IMPLANT
GOWN STRL REUS W/ TWL XL LVL3 (GOWN DISPOSABLE) ×16 IMPLANT
GOWN STRL REUS W/TWL LRG LVL3 (GOWN DISPOSABLE) ×8
GOWN STRL REUS W/TWL XL LVL3 (GOWN DISPOSABLE) ×16
HEMOSTAT POWDER SURGIFOAM 1G (HEMOSTASIS) ×12 IMPLANT
HEMOSTAT SURGICEL 2X14 (HEMOSTASIS) ×4 IMPLANT
INSERT FOGARTY 61MM (MISCELLANEOUS) IMPLANT
INSERT FOGARTY XLG (MISCELLANEOUS) IMPLANT
KIT BASIN OR (CUSTOM PROCEDURE TRAY) ×4 IMPLANT
KIT CATH CPB BARTLE (MISCELLANEOUS) ×4 IMPLANT
KIT ROOM TURNOVER OR (KITS) ×4 IMPLANT
KIT SUCTION CATH 14FR (SUCTIONS) ×4 IMPLANT
KIT VASOVIEW HEMOPRO VH 3000 (KITS) ×4 IMPLANT
NS IRRIG 1000ML POUR BTL (IV SOLUTION) ×20 IMPLANT
PACK E OPEN HEART (SUTURE) ×4 IMPLANT
PACK OPEN HEART (CUSTOM PROCEDURE TRAY) ×4 IMPLANT
PAD ARMBOARD 7.5X6 YLW CONV (MISCELLANEOUS) ×8 IMPLANT
PAD ELECT DEFIB RADIOL ZOLL (MISCELLANEOUS) ×4 IMPLANT
PENCIL BUTTON HOLSTER BLD 10FT (ELECTRODE) ×4 IMPLANT
PUNCH AORTIC ROTATE 4.0MM (MISCELLANEOUS) IMPLANT
PUNCH AORTIC ROTATE 4.5MM 8IN (MISCELLANEOUS) ×4 IMPLANT
PUNCH AORTIC ROTATE 5MM 8IN (MISCELLANEOUS) IMPLANT
SEALANT SURG COSEAL 8ML (VASCULAR PRODUCTS) ×4 IMPLANT
SET CARDIOPLEGIA MPS 5001102 (MISCELLANEOUS) ×4 IMPLANT
SPONGE INTESTINAL PEANUT (DISPOSABLE) IMPLANT
SPONGE LAP 18X18 X RAY DECT (DISPOSABLE) ×4 IMPLANT
SPONGE LAP 4X18 X RAY DECT (DISPOSABLE) ×8 IMPLANT
SUT BONE WAX W31G (SUTURE) ×4 IMPLANT
SUT MNCRL AB 4-0 PS2 18 (SUTURE) IMPLANT
SUT PROLENE 3 0 SH DA (SUTURE) IMPLANT
SUT PROLENE 3 0 SH1 36 (SUTURE) ×4 IMPLANT
SUT PROLENE 4 0 RB 1 (SUTURE)
SUT PROLENE 4 0 SH DA (SUTURE) IMPLANT
SUT PROLENE 4-0 RB1 .5 CRCL 36 (SUTURE) IMPLANT
SUT PROLENE 5 0 C 1 36 (SUTURE) IMPLANT
SUT PROLENE 6 0 C 1 30 (SUTURE) IMPLANT
SUT PROLENE 7 0 BV 1 (SUTURE) IMPLANT
SUT PROLENE 7 0 BV1 MDA (SUTURE) ×8 IMPLANT
SUT PROLENE 8 0 BV175 6 (SUTURE) IMPLANT
SUT SILK  1 MH (SUTURE)
SUT SILK 1 MH (SUTURE) IMPLANT
SUT STEEL STERNAL CCS#1 18IN (SUTURE) IMPLANT
SUT STEEL SZ 6 DBL 3X14 BALL (SUTURE) IMPLANT
SUT VIC AB 1 CTX 36 (SUTURE) ×6
SUT VIC AB 1 CTX36XBRD ANBCTR (SUTURE) ×6 IMPLANT
SUT VIC AB 2-0 CT1 27 (SUTURE) ×2
SUT VIC AB 2-0 CT1 TAPERPNT 27 (SUTURE) ×2 IMPLANT
SUT VIC AB 2-0 CTX 27 (SUTURE) IMPLANT
SUT VIC AB 3-0 SH 27 (SUTURE)
SUT VIC AB 3-0 SH 27X BRD (SUTURE) IMPLANT
SUT VIC AB 3-0 X1 27 (SUTURE) ×4 IMPLANT
SUT VICRYL 4-0 PS2 18IN ABS (SUTURE) IMPLANT
SYSTEM SAHARA CHEST DRAIN ATS (WOUND CARE) ×4 IMPLANT
TAPE CLOTH SURG 4X10 WHT LF (GAUZE/BANDAGES/DRESSINGS) ×8 IMPLANT
TAPE PAPER 2X10 WHT MICROPORE (GAUZE/BANDAGES/DRESSINGS) ×4 IMPLANT
TOWEL GREEN STERILE (TOWEL DISPOSABLE) ×16 IMPLANT
TOWEL GREEN STERILE FF (TOWEL DISPOSABLE) ×8 IMPLANT
TRAY FOLEY SILVER 16FR TEMP (SET/KITS/TRAYS/PACK) ×4 IMPLANT
TUBING INSUFFLATION (TUBING) ×4 IMPLANT
UNDERPAD 30X30 (UNDERPADS AND DIAPERS) ×4 IMPLANT
WATER STERILE IRR 1000ML POUR (IV SOLUTION) ×8 IMPLANT

## 2017-07-12 NOTE — Anesthesia Procedure Notes (Signed)
Arterial Line Insertion Start/End12/02/2017 7:00 AM, 07/12/2017 7:05 AM Performed by: CRNA  Patient location: Pre-op. Preanesthetic checklist: patient identified, IV checked, site marked, risks and benefits discussed, surgical consent, monitors and equipment checked, pre-op evaluation, timeout performed and anesthesia consent Lidocaine 1% used for infiltration Right, radial was placed Catheter size: 20 G  Attempts: 1 Procedure performed without using ultrasound guided technique. Following insertion, dressing applied and Biopatch. Post procedure assessment: normal  Patient tolerated the procedure well with no immediate complications.

## 2017-07-12 NOTE — Progress Notes (Signed)
Patient ID: Daniel Waters, male   DOB: [birth year], 61 y.o.   MRN: [id number]  SICU Evening Rounds:   Hemodynamically stable  CI = 2.0  Has started to wake up on vent. Moves all extremities to command   Urine output good  CT output low  CBC    Component Value Date/Time   WBC 12.3 (H) 07/12/2017 1245   RBC 3.15 (L) 07/12/2017 1245   HGB 9.9 (L) 07/12/2017 1300   HGB 12.7 (L) 06/18/2017 1223   HCT 29.0 (L) 07/12/2017 1300   HCT 39.0 06/18/2017 1223   PLT 156 07/12/2017 1245   PLT 209 06/18/2017 1223   MCV 96.5 07/12/2017 1245   MCV 101 (H) 06/18/2017 1223   MCH 32.1 07/12/2017 1245   MCHC 33.2 07/12/2017 1245   RDW 12.6 07/12/2017 1245   RDW 12.4 06/18/2017 1223   LYMPHSABS 2.1 06/18/2017 1223   EOSABS 0.1 06/18/2017 1223   BASOSABS 0.0 06/18/2017 1223     BMET    Component Value Date/Time   NA 139 07/12/2017 1300   NA 143 06/19/2017 0457   K 4.6 07/12/2017 1300   CL 105 07/12/2017 1155   CO2 20 (L) 07/10/2017 1138   GLUCOSE 106 (H) 07/12/2017 1300   BUN 21 (H) 07/12/2017 1155   BUN 18 06/19/2017 0457   CREATININE 1.00 07/12/2017 1155   CALCIUM 9.6 07/10/2017 1138   GFRNONAA >60 07/10/2017 1138   GFRAA >60 07/10/2017 1138     A/P:  Stable postop course. Continue current plans

## 2017-07-12 NOTE — Progress Notes (Signed)
  Echocardiogram Echocardiogram Transesophageal has been performed.  Daniel Waters G Daniel Waters 07/12/2017, 8:29 AM

## 2017-07-12 NOTE — Anesthesia Preprocedure Evaluation (Addendum)
Anesthesia Evaluation  Patient identified by MRN, date of birth, ID band Patient awake    Reviewed: Allergy & Precautions, NPO status , Patient's Chart, lab work & pertinent test results, reviewed documented beta blocker date and time   Airway Mallampati: II  TM Distance: <3 FB Neck ROM: Full    Dental  (+) Teeth Intact, Dental Advisory Given   Pulmonary sleep apnea , former smoker,    Pulmonary exam normal breath sounds clear to auscultation       Cardiovascular hypertension, Pt. on medications and Pt. on home beta blockers + CAD  Normal cardiovascular exam Rhythm:Regular Rate:Normal  Cath 06/24/17: 1. Severe 2 vessel obstructive CAD     - Complex calcified stenosis in the proximal to mid LAD 70% with markedly abnormal FFR of 0.73    - 99% mid first diagonal at bifurcation point    - 85% mid RCA. Complex lesion with heavy calcification 2. Normal LV function 3. Normal LVEDP.   Neuro/Psych PSYCHIATRIC DISORDERS Anxiety negative neurological ROS     GI/Hepatic negative GI ROS, Neg liver ROS,   Endo/Other  Obesity   Renal/GU negative Renal ROS     Musculoskeletal  (+) Arthritis ,   Abdominal   Peds  Hematology negative hematology ROS (+)   Anesthesia Other Findings Day of surgery medications reviewed with the patient.  Reproductive/Obstetrics                            Anesthesia Physical Anesthesia Plan  ASA: IV  Anesthesia Plan: General   Post-op Pain Management:    Induction: Intravenous  PONV Risk Score and Plan: 2 and Treatment may vary due to age or medical condition, Midazolam and Propofol infusion  Airway Management Planned: Oral ETT  Additional Equipment: Arterial line, CVP, PA Cath, TEE and Ultrasound Guidance Line Placement  Intra-op Plan:   Post-operative Plan: Post-operative intubation/ventilation  Informed Consent: I have reviewed the patients History and  Physical, chart, labs and discussed the procedure including the risks, benefits and alternatives for the proposed anesthesia with the patient or authorized representative who has indicated his/her understanding and acceptance.   Dental advisory given  Plan Discussed with: CRNA  Anesthesia Plan Comments: (Risks/benefits of general anesthesia discussed with patient including risk of damage to teeth, lips, gum, and tongue, nausea/vomiting, allergic reactions to medications, and the possibility of heart attack, stroke and death.  All patient questions answered.  Patient wishes to proceed.)        Anesthesia Quick Evaluation

## 2017-07-12 NOTE — Anesthesia Procedure Notes (Signed)
Procedure Name: Intubation Date/Time: 07/12/2017 7:45 AM Performed by: Marena ChancyBeckner, Kanan Sobek S, CRNA Pre-anesthesia Checklist: Patient identified, Emergency Drugs available, Suction available and Patient being monitored Patient Re-evaluated:Patient Re-evaluated prior to induction Oxygen Delivery Method: Circle System Utilized Preoxygenation: Pre-oxygenation with 100% oxygen Induction Type: IV induction Ventilation: Mask ventilation without difficulty and Oral airway inserted - appropriate to patient size Laryngoscope Size: Hyacinth MeekerMiller and 2 Grade View: Grade I Tube type: Oral Tube size: 8.0 mm Number of attempts: 1 Airway Equipment and Method: Stylet and Oral airway Placement Confirmation: ETT inserted through vocal cords under direct vision,  positive ETCO2 and breath sounds checked- equal and bilateral Tube secured with: Tape Dental Injury: Teeth and Oropharynx as per pre-operative assessment

## 2017-07-12 NOTE — Interval H&P Note (Signed)
History and Physical Interval Note:  07/12/2017 5:57 AM  Daniel Waters  has presented today for surgery, with the diagnosis of Coronary Artery Disease  The various methods of treatment have been discussed with the patient and family. After consideration of risks, benefits and other options for treatment, the patient has consented to  Procedure(s): CORONARY ARTERY BYPASS GRAFTING (CABG), ENDOSCOPIC VESSEL HARVESTING, TEE (N/A) TRANSESOPHAGEAL ECHOCARDIOGRAM (TEE) (N/A) as a surgical intervention .  The patient's history has been reviewed, patient examined, no change in status, stable for surgery.  I have reviewed the patient's chart and labs.  Questions were answered to the patient's satisfaction.     Alleen BorneBryan K Margean Korell

## 2017-07-12 NOTE — Care Management Note (Signed)
Case Management Note  Patient Details  Name: Debby BudDonald Deas MRN: [id number] Date of Birth: 21-Nov-1955  Subjective/Objective:  From home with wife, post op CABG, conts on vent.                   Action/Plan: NCM will follow for dc needs.   Expected Discharge Date:  07/19/17               Expected Discharge Plan:     In-House Referral:     Discharge planning Services  CM Consult  Post Acute Care Choice:    Choice offered to:     DME Arranged:    DME Agency:     HH Arranged:    HH Agency:     Status of Service:  In process, will continue to follow  If discussed at Long Length of Stay Meetings, dates discussed:    Additional Comments:  Leone Havenaylor, Mara Favero Clinton, RN 07/12/2017, 2:38 PM

## 2017-07-12 NOTE — Anesthesia Procedure Notes (Signed)
Central Venous Catheter Insertion Performed by: Dorris SinghGreen, Audry Pecina, MD, anesthesiologist Start/End12/02/2017 6:55 AM, 07/12/2017 7:10 AM Patient location: OR. Preanesthetic checklist: patient identified, IV checked, site marked, risks and benefits discussed, surgical consent, monitors and equipment checked, pre-op evaluation and timeout performed Position: Trendelenburg Lidocaine 1% used for infiltration and patient sedated Hand hygiene performed  and maximum sterile barriers used  PA cath was placed.Sheath introducer Swan type:thermodilution Procedure performed using ultrasound guided technique. Ultrasound Notes:anatomy identified and image(s) printed for medical record Attempts: 1 Following insertion, line sutured, dressing applied and Biopatch. Post procedure assessment: blood return through all ports  Patient tolerated the procedure well with no immediate complications.

## 2017-07-12 NOTE — Op Note (Signed)
CARDIOVASCULAR SURGERY OPERATIVE NOTE  07/12/2017  Surgeon:  Alleen BorneBryan K. Bartle, MD  First Assistant: Gershon CraneWayne Gold,  PA-C   Preoperative Diagnosis:  Severe multi-vessel coronary artery disease   Postoperative Diagnosis:  Same   Procedure:  1. Median Sternotomy 2. Extracorporeal circulation 3.   Coronary artery bypass grafting x 3   Left internal mammary graft to the LAD  SVG to Diagonal  SVG to PDA  4.   Endoscopic vein harvest from the right leg   Anesthesia:  General Endotracheal   Clinical History/Surgical Indication:  The patient is a 61 year old gentleman with a long history of hypertriglyceridemia, hypertension and remote smoking who had no prior cardiac history and is very active exercising vigorously several days per week. He mentioned to his PCP that he had some pain in his left arm with activity recently and had an ECG that was abnormal. He was seen by Dr. Dulce SellarMunley and had a exercise myocardial perfusion study that was a high risk study with markedly abnormal ECG changes prompting discontinuation of the study. His EF was 48%. There was a medium defect of moderate severity in the apical lateral walls consistent with ischemia. He underwent cardiac cath on 06/24/2017 showing severe 2 vessel CAD with a complex calcified proximal to mid LAD stenosis of 70% with a markedly abnormal FFR of 0.73. The D1 had 99% stenosis at a bifurcation. The RCA was diffusely diseased with calcific plaque with an 85% mid vessel stenosis. LV function was normal with normal LVEDP. The patient says that he has not been doing any exercise since his cath and has had no chest or arm pain. He denies shortness of breath and feels like his stamina is normal.  This gentleman has severe 2-vessel coronary artery disease with a markedly abnormal LAD FFR and a high risk nuclear stress test. I agree that CABG is the best  treatment for him. I reviewed his cath films with him and his wife and answered their questions. I discussed the operative procedure with the patient and his wife including alternatives, benefits and risks; including but not limited to bleeding, blood transfusion, infection, stroke, myocardial infarction, graft failure, heart block requiring a permanent pacemaker, organ dysfunction, and death. Debby Budonald Bothun understands and agrees to proceed.    Preparation:  The patient was seen in the preoperative holding area and the correct patient, correct operation were confirmed with the patient after reviewing the medical record and catheterization. The consent was signed by me. Preoperative antibiotics were given. A pulmonary arterial line and radial arterial line were placed by the anesthesia team. The patient was taken back to the operating room and positioned supine on the operating room table. After being placed under general endotracheal anesthesia by the anesthesia team a foley catheter was placed. The neck, chest, abdomen, and both legs were prepped with betadine soap and solution and draped in the usual sterile manner. A surgical time-out was taken and the correct patient and operative procedure were confirmed with the nursing and anesthesia staff.   Cardiopulmonary Bypass:  A median sternotomy was performed. The pericardium was opened in the midline. Right ventricular function appeared normal. The ascending aorta was of normal size and had no palpable plaque. There were no contraindications to aortic cannulation or cross-clamping. The patient was fully systemically heparinized and the ACT was maintained > 400 sec. The proximal aortic arch was cannulated with a 22 F aortic cannula for arterial inflow. Venous cannulation was performed via the right atrial appendage using  a two-staged venous cannula. An antegrade cardioplegia/vent cannula was inserted into the mid-ascending aorta. Aortic occlusion was  performed with a single cross-clamp. Systemic cooling to 32 degrees Centigrade and topical cooling of the heart with iced saline were used. Hyperkalemic antegrade cold blood cardioplegia was used to induce diastolic arrest and was then given at about 20 minute intervals throughout the period of arrest to maintain myocardial temperature at or below 10 degrees centigrade. A temperature probe was inserted into the interventricular septum and an insulating pad was placed in the pericardium.   Left internal mammary harvest:  The left side of the sternum was retracted using the Rultract retractor. The left internal mammary artery was harvested as a pedicle graft. All side branches were clipped. It was a small-sized vessel of good quality with good blood flow. It was ligated distally and divided. It was sprayed with topical papaverine solution to prevent vasospasm.   Endoscopic vein harvest:  The right greater saphenous vein was harvested endoscopically through a 2 cm incision medial to the right knee. It was harvested from the upper thigh to below the knee. It was a medium-sized vein of good quality. The side branches were all ligated with 4-0 silk ties.    Coronary arteries:  The coronary arteries were examined.   LAD:  Diffusely diseased with calcific plaque out to the apex. The diagonal was also diffusely diseased  LCX:  Diffusely diseased but no stenosis on cath  RCA:  Diffusely diseased throughout the main vessel. The PDA and PL were small caliber but no distal disease.    Grafts:  1. LIMA to the LAD: 2.5 mm. It was sewn end to side using 8-0 prolene continuous suture. 2. SVG to diagonal:  1.6 mm. It was sewn end to side using 7-0 prolene continuous suture. 3. SVG to PDA:  1.6 mm. It was sewn end to side using 7-0 prolene continuous suture.   The proximal vein graft anastomoses were performed to the mid-ascending aorta using continuous 6-0 prolene suture. Graft markers were placed  around the proximal anastomoses.   Completion:  The patient was rewarmed to 37 degrees Centigrade. The clamp was removed from the LIMA pedicle and there was rapid warming of the septum and return of ventricular fibrillation. The crossclamp was removed with a time of 80 minutes. There was spontaneous return of sinus rhythm. The distal and proximal anastomoses were checked for hemostasis. The position of the grafts was satisfactory. Two temporary epicardial pacing wires were placed on the right atrium and two on the right ventricle. The patient was weaned from CPB without difficulty on no inotropes. CPB time was 98 minutes. Cardiac output was 5 LPM. TEE showed normal LV function. Heparin was fully reversed with protamine and the aortic and venous cannulas removed. Hemostasis was achieved. Mediastinal and left pleural drainage tubes were placed. The sternum was closed with double #6 stainless steel wires. The fascia was closed with continuous # 1 vicryl suture. The subcutaneous tissue was closed with 2-0 vicryl continuous suture. The skin was closed with 3-0 vicryl subcuticular suture. All sponge, needle, and instrument counts were reported correct at the end of the case. Dry sterile dressings were placed over the incisions and around the chest tubes which were connected to pleurevac suction. The patient was then transported to the surgical intensive care unit in critical but stable condition.

## 2017-07-12 NOTE — Brief Op Note (Signed)
07/12/2017  11:12 AM  PATIENT:  Daniel Waters  61 y.o. male  PRE-OPERATIVE DIAGNOSIS:  Coronary Artery Disease  POST-OPERATIVE DIAGNOSIS:  Coronary Artery Disease  PROCEDURE:  Procedure(s): CORONARY ARTERY BYPASS GRAFTING (CABG)X3, USING LEFT INTERNAL MAMMARY ARTERY AND RIGHT GREATER SAPHENOUS VEIN ENDOSCOPIC VESSEL HARVESTING, TEE (N/A) TRANSESOPHAGEAL ECHOCARDIOGRAM (TEE) (N/A)  SURGEON:  Surgeon(s) and Role:    * Bartle, Payton DoughtyBryan K, MD - Primary  PHYSICIAN ASSISTANT: Rakayla Ricklefs PA-C  ANESTHESIA:   general  EBL:    BLOOD ADMINISTERED:none  DRAINS: PLEURAL AND MEDIASTINAL CHEST TUBES   LOCAL MEDICATIONS USED:  NONE  SPECIMEN:  No Specimen  DISPOSITION OF SPECIMEN:  N/A  COUNTS:  YES  TOURNIQUET:  * No tourniquets in log *  DICTATION: .Dragon Dictation  PLAN OF CARE: Admit to inpatient   PATIENT DISPOSITION:  ICU - intubated and hemodynamically stable.   Delay start of Pharmacological VTE agent (>24hrs) due to surgical blood loss or risk of bleeding: yes  COMPLICATIONS: NO KNOWN

## 2017-07-12 NOTE — Plan of Care (Signed)
Weaned and extubated, participating with IS, DB&C, pt is calm, wife states he was very anxious prior to surgery, support offered, pain controlled, resting, still on some vasopressor support

## 2017-07-12 NOTE — Transfer of Care (Signed)
Immediate Anesthesia Transfer of Care Note  Patient: Daniel Waters  Procedure(s) Performed: CORONARY ARTERY BYPASS GRAFTING (CABG)X3, USING LEFT INTERNAL MAMMARY ARTERY AND RIGHT GREATER SAPHENOUS VEIN ENDOSCOPIC VESSEL HARVESTING, TEE (N/A Chest) TRANSESOPHAGEAL ECHOCARDIOGRAM (TEE) (N/A )  Patient Location: ICU  Anesthesia Type:General  Level of Consciousness: awake, alert  and oriented  Airway & Oxygen Therapy: Patient remains intubated per anesthesia plan and Patient placed on Ventilator (see vital sign flow sheet for setting)  Post-op Assessment: Report given to RN and Post -op Vital signs reviewed and stable  Post vital signs: Reviewed and stable  Last Vitals:  Vitals:   07/12/17 0618  BP: 140/86  Pulse: 74  Resp: 20  Temp: 36.6 C  SpO2: 98%    Last Pain:  Vitals:   07/12/17 0618  TempSrc: Oral      Patients Stated Pain Goal: 3 (07/12/17 [id number])  Complications: No apparent anesthesia complications

## 2017-07-12 NOTE — Procedures (Signed)
Extubation Procedure Note  Patient Details:   Name: Daniel Waters DOB: 1956-02-15 MRN: [id number]   Airway Documentation:     Evaluation  O2 sats: stable throughout Complications: No apparent complications Patient did tolerate procedure well. Bilateral Breath Sounds: Clear, Diminished   Yes   Positive cuff leak noted, NIF - 26, VC 670 mL.  Pt placed on [place] 4 L with humidity, no stridor noted. Pt able to reach 500 using incentive spirometer.  Rayburn FeltJean S Brookelle Pellicane 07/12/2017, 6:19 PM

## 2017-07-13 ENCOUNTER — Inpatient Hospital Stay (HOSPITAL_COMMUNITY): Payer: BLUE CROSS/BLUE SHIELD

## 2017-07-13 LAB — CBC
HCT: 28.4 % — ABNORMAL LOW (ref 39.0–52.0)
HCT: 29.7 % — ABNORMAL LOW (ref 39.0–52.0)
HEMATOCRIT: 29.4 % — AB (ref 39.0–52.0)
HEMOGLOBIN: 9.5 g/dL — AB (ref 13.0–17.0)
HEMOGLOBIN: 9.7 g/dL — AB (ref 13.0–17.0)
Hemoglobin: 9.8 g/dL — ABNORMAL LOW (ref 13.0–17.0)
MCH: 32.1 pg (ref 26.0–34.0)
MCH: 31.7 pg (ref 26.0–34.0)
MCH: 32.5 pg (ref 26.0–34.0)
MCHC: 33.5 g/dL (ref 30.0–36.0)
MCHC: 32.7 g/dL (ref 30.0–36.0)
MCHC: 33.3 g/dL (ref 30.0–36.0)
MCV: 95.9 fL (ref 78.0–100.0)
MCV: 97.1 fL (ref 78.0–100.0)
MCV: 97.4 fL (ref 78.0–100.0)
PLATELETS: 184 10*3/uL (ref 150–400)
PLATELETS: 144 10*3/uL — AB (ref 150–400)
Platelets: 154 10*3/uL (ref 150–400)
RBC: 2.96 MIL/uL — ABNORMAL LOW (ref 4.22–5.81)
RBC: 3.06 MIL/uL — AB (ref 4.22–5.81)
RBC: 3.02 MIL/uL — ABNORMAL LOW (ref 4.22–5.81)
RDW: 12.8 % (ref 11.5–15.5)
RDW: 13.1 % (ref 11.5–15.5)
RDW: 12.8 % (ref 11.5–15.5)
WBC: 10.8 10*3/uL — ABNORMAL HIGH (ref 4.0–10.5)
WBC: 10 10*3/uL (ref 4.0–10.5)
WBC: 10.8 10*3/uL — AB (ref 4.0–10.5)

## 2017-07-13 LAB — POCT I-STAT, CHEM 8
BUN: 12 mg/dL (ref 6–20)
CREATININE: 1 mg/dL (ref 0.61–1.24)
Calcium, Ion: 1.21 mmol/L (ref 1.15–1.40)
Chloride: 101 mmol/L (ref 101–111)
Glucose, Bld: 136 mg/dL — ABNORMAL HIGH (ref 65–99)
HEMATOCRIT: 29 % — AB (ref 39.0–52.0)
Hemoglobin: 9.9 g/dL — ABNORMAL LOW (ref 13.0–17.0)
Potassium: 3.7 mmol/L (ref 3.5–5.1)
Sodium: 139 mmol/L (ref 135–145)
TCO2: 24 mmol/L (ref 22–32)

## 2017-07-13 LAB — MAGNESIUM
MAGNESIUM: 2.1 mg/dL (ref 1.7–2.4)
MAGNESIUM: 1.8 mg/dL (ref 1.7–2.4)

## 2017-07-13 LAB — BASIC METABOLIC PANEL
Anion gap: 8 (ref 5–15)
BUN: 16 mg/dL (ref 6–20)
CALCIUM: 8.3 mg/dL — AB (ref 8.9–10.3)
CO2: 22 mmol/L (ref 22–32)
CREATININE: 0.96 mg/dL (ref 0.61–1.24)
Chloride: 104 mmol/L (ref 101–111)
GFR calc non Af Amer: >60 mL/min (ref >60)
Glucose, Bld: 119 mg/dL — ABNORMAL HIGH (ref 65–99)
Potassium: 4.3 mmol/L (ref 3.5–5.1)
SODIUM: 134 mmol/L — AB (ref 135–145)

## 2017-07-13 LAB — CREATININE, SERUM
CREATININE: 1.06 mg/dL (ref 0.61–1.24)
CREATININE: 1.17 mg/dL (ref 0.61–1.24)
GFR calc Af Amer: >60 mL/min (ref >60)

## 2017-07-13 LAB — GLUCOSE, CAPILLARY
GLUCOSE-CAPILLARY: 128 mg/dL — AB (ref 65–99)
GLUCOSE-CAPILLARY: 146 mg/dL — AB (ref 65–99)
GLUCOSE-CAPILLARY: 153 mg/dL — AB (ref 65–99)
Glucose-Capillary: 123 mg/dL — ABNORMAL HIGH (ref 65–99)
Glucose-Capillary: 118 mg/dL — ABNORMAL HIGH (ref 65–99)

## 2017-07-13 MED ORDER — POTASSIUM CHLORIDE 10 MEQ/50ML IV SOLN
10.0000 meq | INTRAVENOUS | Status: AC
  Administered 2017-07-13 (×3): 10 meq via INTRAVENOUS
  Filled 2017-07-13 (×3): qty 50

## 2017-07-13 MED ORDER — ENOXAPARIN SODIUM 40 MG/0.4ML ~~LOC~~ SOLN
40.0000 mg | Freq: Every day | SUBCUTANEOUS | Status: DC
  Administered 2017-07-13 – 2017-07-14 (×2): 40 mg via SUBCUTANEOUS
  Filled 2017-07-13 (×2): qty 0.4

## 2017-07-13 MED ORDER — LORAZEPAM 1 MG PO TABS
1.0000 mg | ORAL_TABLET | Freq: Two times a day (BID) | ORAL | Status: DC | PRN
  Administered 2017-07-13: 1 mg via ORAL
  Filled 2017-07-13 (×2): qty 1

## 2017-07-13 MED ORDER — ORAL CARE MOUTH RINSE
15.0000 mL | Freq: Two times a day (BID) | OROMUCOSAL | Status: DC
  Administered 2017-07-13 – 2017-07-16 (×6): 15 mL via OROMUCOSAL

## 2017-07-13 NOTE — Plan of Care (Signed)
  Progressing Health Behavior/Discharge Planning: Ability to manage health-related needs will improve 07/13/2017 2256 - Progressing by Jasper RilingSarine, Jon Kasparek M, RN Clinical Measurements: Ability to maintain clinical measurements within normal limits will improve 07/13/2017 2256 - Progressing by Jasper RilingSarine, Ayahna Solazzo M, RN Will remain free from infection 07/13/2017 2256 - Progressing by Jasper RilingSarine, Elohim Brune M, RN Diagnostic test results will improve 07/13/2017 2256 - Progressing by Jasper RilingSarine, Ieesha Abbasi M, RN Respiratory complications will improve 07/13/2017 2256 - Progressing by Jasper RilingSarine, Nita Whitmire M, RN Note Weaned off oxygen, participating with IS, DB&C well Cardiovascular complication will be avoided 07/13/2017 2256 - Progressing by Jasper RilingSarine, Kurtis Anastasia M, RN Activity: Risk for activity intolerance will decrease 07/13/2017 2256 - Progressing by Jasper RilingSarine, Birt Reinoso M, RN Nutrition: Adequate nutrition will be maintained 07/13/2017 2256 - Progressing by Jasper RilingSarine, Rashida Ladouceur M, RN Coping: Level of anxiety will decrease 07/13/2017 2256 - Progressing by Jasper RilingSarine, Abreanna Drawdy M, RN Note Pt remains anxious but positive Pain Managment: General experience of comfort will improve 07/13/2017 2256 - Progressing by Jasper RilingSarine, Ahlaya Ende M, RN Safety: Ability to remain free from injury will improve 07/13/2017 2256 - Progressing by Jasper RilingSarine, Kemar Pandit M, RN Education: Ability to demonstrate proper wound care will improve 07/13/2017 2256 - Progressing by Jasper RilingSarine, Jo Cerone M, RN Knowledge of disease or condition will improve 07/13/2017 2256 - Progressing by Jasper RilingSarine, Jakylah Bassinger M, RN Knowledge of the prescribed therapeutic regimen will improve 07/13/2017 2256 - Progressing by Jasper RilingSarine, Raeleigh Guinn M, RN Activity: Risk for activity intolerance will decrease 07/13/2017 2256 - Progressing by Jasper RilingSarine, Clary Boulais M, RN Note Pt advanced to ambulation today and tolerated well Cardiac: Hemodynamic stability will improve 07/13/2017 2256 - Progressing by Jasper RilingSarine, Malachi Suderman M,  RN Clinical Measurements: Postoperative complications will be avoided or minimized 07/13/2017 2256 - Progressing by Jasper RilingSarine, Laquanna Veazey M, RN Skin Integrity: Wound healing without signs and symptoms of infection 07/13/2017 2256 - Progressing by Jasper RilingSarine, Teyonna Plaisted M, RN Risk for impaired skin integrity will decrease 07/13/2017 2256 - Progressing by Jasper RilingSarine, Lytle Malburg M, RN Urinary Elimination: Ability to achieve and maintain adequate renal perfusion and functioning will improve 07/13/2017 2256 - Progressing by Jasper RilingSarine, Meka Lewan M, RN

## 2017-07-13 NOTE — Progress Notes (Signed)
1 Day Post-Op Procedure(s) (LRB): CORONARY ARTERY BYPASS GRAFTING (CABG)X3, USING LEFT INTERNAL MAMMARY ARTERY AND RIGHT GREATER SAPHENOUS VEIN ENDOSCOPIC VESSEL HARVESTING, TEE (N/A) TRANSESOPHAGEAL ECHOCARDIOGRAM (TEE) (N/A) Subjective: Some pain but overall feels ok  Objective: Vital signs in last 24 hours: Temp:  [95.9 F (35.5 C)-99.1 F (37.3 C)] 99.1 F (37.3 C) (12/08 0800) Pulse Rate:  [81-98] 81 (12/08 0600) Cardiac Rhythm: Normal sinus rhythm (12/08 0800) Resp:  [0-32] 23 (12/08 0800) BP: (90-124)/(52-87) 103/58 (12/08 0800) SpO2:  [92 %-100 %] 92 % (12/08 0800) Arterial Line BP: (84-132)/(45-78) 106/53 (12/08 0800) FiO2 (%):  [40 %-50 %] 40 % (12/07 1637) Weight:  [104.6 kg (230 lb 11 oz)-104.7 kg (230 lb 13.2 oz)] 104.7 kg (230 lb 13.2 oz) (12/08 0500)  Hemodynamic parameters for last 24 hours: PAP: (20-45)/(8-22) 45/22 CO:  [4.5 L/min-5.8 L/min] 5.8 L/min CI:  [1.9 L/min/m2-2.5 L/min/m2] 2.5 L/min/m2  Intake/Output from previous day: 12/07 0701 - 12/08 0700 In: 7036 [P.O.:250; I.V.:4886; Blood:200; IV Piggyback:1700] Out: 4850 [Urine:3855; Emesis/NG output:135; Blood:300; Chest Tube:560] Intake/Output this shift: Total I/O In: 70 [I.V.:70] Out: 480 [Urine:410; Chest Tube:70]  General appearance: alert and cooperative Neurologic: intact Heart: regular rate and rhythm, S1, S2 normal, no murmur, click, rub or gallop Lungs: clear to auscultation bilaterally Extremities: edema mild Wound: dressings dry  Lab Results: Recent Labs    07/12/17 1818 07/12/17 1819 07/13/17 0334  WBC 12.2*  --  10.8*  HGB 10.3* 9.9* 9.5*  HCT 32.2* 29.0* 28.4*  PLT 154  --  184   BMET:  Recent Labs    07/10/17 1138  07/12/17 1819 07/13/17 0334  NA 137   < > 138 134*  K 4.4   < > 4.7 4.3  CL 105   < > 106 104  CO2 20*  --   --  22  GLUCOSE 96   < > 130* 119*  BUN 18   < > 18 16  CREATININE 1.10   < > 0.90 0.96  CALCIUM 9.6  --   --  8.3*   < > = values in this  interval not displayed.    PT/INR:  Recent Labs    07/12/17 1245  LABPROT 15.3*  INR 1.22   ABG    Component Value Date/Time   PHART 7.366 07/12/2017 1823   HCO3 20.0 07/12/2017 1823   TCO2 21 (L) 07/12/2017 1823   ACIDBASEDEF 5.0 (H) 07/12/2017 1823   O2SAT 99.0 07/12/2017 1823   CBG (last 3)  Recent Labs    07/12/17 2335 07/13/17 0339 07/13/17 0805  GLUCAP 146* 123* 128*   CXR: ok  ECG: sinus, no acute changes  Assessment/Plan: S/P Procedure(s) (LRB): CORONARY ARTERY BYPASS GRAFTING (CABG)X3, USING LEFT INTERNAL MAMMARY ARTERY AND RIGHT GREATER SAPHENOUS VEIN ENDOSCOPIC VESSEL HARVESTING, TEE (N/A) TRANSESOPHAGEAL ECHOCARDIOGRAM (TEE) (N/A)  POD 1 He is hemodynamically stable in sinus rhythm Mobilize Diuresis Diabetes control: preop Hgb A1c was 5.8 and glucose under control so will stop SSI. d/c tubes/lines Continue foley due to patient in ICU and urinary output monitoring See progression orders   LOS: 1 day    Alleen BorneBryan K Louann Hopson 07/13/2017

## 2017-07-13 NOTE — Plan of Care (Signed)
Doing very well post operative day #1. Ambulated around unit, Encouraged pain management, not waiting until it gets bad. Using IS routinely. O2 for comfort, sats over 93% on room air. No s/s of infection. Hemodynamically stable

## 2017-07-13 NOTE — Progress Notes (Signed)
Ambulated to Bathroom from chair. Passed gas felt a little dizzy getting up. Ambulated to bed, c/o pain, medicated with oxycodone. Patient anxious, tired. Has been sitting up all day and ambulated the whole unit. Resting after pain medication. Family at bedside

## 2017-07-14 ENCOUNTER — Inpatient Hospital Stay (HOSPITAL_COMMUNITY): Payer: BLUE CROSS/BLUE SHIELD

## 2017-07-14 LAB — CBC
HCT: 28.4 % — ABNORMAL LOW (ref 39.0–52.0)
HEMOGLOBIN: 9.3 g/dL — AB (ref 13.0–17.0)
MCH: 31.7 pg (ref 26.0–34.0)
MCHC: 32.7 g/dL (ref 30.0–36.0)
MCV: 96.9 fL (ref 78.0–100.0)
Platelets: 129 10*3/uL — ABNORMAL LOW (ref 150–400)
RBC: 2.93 MIL/uL — AB (ref 4.22–5.81)
RDW: 13.1 % (ref 11.5–15.5)
WBC: 10.7 10*3/uL — ABNORMAL HIGH (ref 4.0–10.5)

## 2017-07-14 LAB — BASIC METABOLIC PANEL
ANION GAP: 7 (ref 5–15)
BUN: 13 mg/dL (ref 6–20)
CALCIUM: 8.6 mg/dL — AB (ref 8.9–10.3)
CHLORIDE: 104 mmol/L (ref 101–111)
CO2: 26 mmol/L (ref 22–32)
Creatinine, Ser: 0.97 mg/dL (ref 0.61–1.24)
GFR calc non Af Amer: >60 mL/min (ref >60)
Glucose, Bld: 130 mg/dL — ABNORMAL HIGH (ref 65–99)
Potassium: 4.3 mmol/L (ref 3.5–5.1)
Sodium: 137 mmol/L (ref 135–145)

## 2017-07-14 MED ORDER — POTASSIUM CHLORIDE CRYS ER 20 MEQ PO TBCR
20.0000 meq | EXTENDED_RELEASE_TABLET | Freq: Two times a day (BID) | ORAL | Status: DC
  Administered 2017-07-14: 20 meq via ORAL
  Filled 2017-07-14 (×2): qty 1

## 2017-07-14 MED ORDER — FUROSEMIDE 40 MG PO TABS
40.0000 mg | ORAL_TABLET | Freq: Every day | ORAL | Status: DC
Start: 2017-07-14 — End: 2017-07-15
  Administered 2017-07-14: 40 mg via ORAL
  Filled 2017-07-14: qty 1

## 2017-07-14 NOTE — Anesthesia Postprocedure Evaluation (Signed)
Anesthesia Post Note  Patient: Daniel Waters  Procedure(s) Performed: CORONARY ARTERY BYPASS GRAFTING (CABG)X3, USING LEFT INTERNAL MAMMARY ARTERY AND RIGHT GREATER SAPHENOUS VEIN ENDOSCOPIC VESSEL HARVESTING, TEE (N/A Chest) TRANSESOPHAGEAL ECHOCARDIOGRAM (TEE) (N/A )     Patient location during evaluation: SICU Anesthesia Type: General Level of consciousness: sedated Pain management: pain level controlled Vital Signs Assessment: post-procedure vital signs reviewed and stable Respiratory status: patient remains intubated per anesthesia plan Cardiovascular status: stable Postop Assessment: no apparent nausea or vomiting Anesthetic complications: no Comments: To SICU intubated, sedated.  No antiemetics due to prolonged post-op intubation.    Last Vitals:  Vitals:   07/14/17 1900 07/14/17 2000  BP: 120/68 115/68  Pulse:    Resp: 16 18  Temp:  36.7 C  SpO2: 95% 95%    Last Pain:  Vitals:   07/14/17 2000  TempSrc: Oral  PainSc:                  Cecile HearingStephen Edward Turk

## 2017-07-14 NOTE — Progress Notes (Signed)
2 Days Post-Op Procedure(s) (LRB): CORONARY ARTERY BYPASS GRAFTING (CABG)X3, USING LEFT INTERNAL MAMMARY ARTERY AND RIGHT GREATER SAPHENOUS VEIN ENDOSCOPIC VESSEL HARVESTING, TEE (N/A) TRANSESOPHAGEAL ECHOCARDIOGRAM (TEE) (N/A) Subjective: No complaints  Objective: Vital signs in last 24 hours: Temp:  [98.2 F (36.8 C)-99.2 F (37.3 C)] 98.5 F (36.9 C) (12/09 1122) Cardiac Rhythm: Sinus tachycardia (12/09 0800) Resp:  [17-30] 23 (12/09 1200) BP: (98-141)/(61-84) 131/73 (12/09 1200) SpO2:  [89 %-98 %] 97 % (12/09 1200) Weight:  [105.7 kg (233 lb 0.4 oz)] 105.7 kg (233 lb 0.4 oz) (12/09 0500)  Hemodynamic parameters for last 24 hours:    Intake/Output from previous day: 12/08 0701 - 12/09 0700 In: 1262.9 [P.O.:770; I.V.:242.9; IV Piggyback:250] Out: 2090 [Urine:2020; Chest Tube:70] Intake/Output this shift: Total I/O In: 300 [P.O.:300] Out: 200 [Urine:200]  General appearance: alert and cooperative Neurologic: intact Heart: regular rate and rhythm, S1, S2 normal, no murmur, click, rub or gallop Lungs: clear to auscultation bilaterally Extremities: extremities normal, atraumatic, no cyanosis or edema Wound: dressings dry  Lab Results: Recent Labs    07/13/17 1700 07/13/17 1717 07/14/17 0411  WBC 10.8*  --  10.7*  HGB 9.8* 9.9* 9.3*  HCT 29.4* 29.0* 28.4*  PLT 144*  --  129*   BMET:  Recent Labs    07/13/17 0334  07/13/17 1717 07/14/17 0411  NA 134*  --  139 137  K 4.3  --  3.7 4.3  CL 104  --  101 104  CO2 22  --   --  26  GLUCOSE 119*  --  136* 130*  BUN 16  --  12 13  CREATININE 0.96   < > 1.00 0.97  CALCIUM 8.3*  --   --  8.6*   < > = values in this interval not displayed.    PT/INR:  Recent Labs    07/12/17 1245  LABPROT 15.3*  INR 1.22   ABG    Component Value Date/Time   PHART 7.366 07/12/2017 1823   HCO3 20.0 07/12/2017 1823   TCO2 24 07/13/2017 1717   ACIDBASEDEF 5.0 (H) 07/12/2017 1823   O2SAT 99.0 07/12/2017 1823   CBG (last 3)   Recent Labs    07/13/17 0805 07/13/17 1134 07/13/17 1624  GLUCAP 128* 153* 118*   CXR: clear  Assessment/Plan: S/P Procedure(s) (LRB): CORONARY ARTERY BYPASS GRAFTING (CABG)X3, USING LEFT INTERNAL MAMMARY ARTERY AND RIGHT GREATER SAPHENOUS VEIN ENDOSCOPIC VESSEL HARVESTING, TEE (N/A) TRANSESOPHAGEAL ECHOCARDIOGRAM (TEE) (N/A)  POD 2 Hemodynamically stable in sinus rhythm  Mild volume excess: will give oral lasix for 2 doses  DC sleeve and foley  Continue IS and ambulation   LOS: 2 days    Daniel Waters 07/14/2017

## 2017-07-15 ENCOUNTER — Encounter (HOSPITAL_COMMUNITY): Payer: Self-pay | Admitting: Surgery

## 2017-07-15 MED ORDER — METOPROLOL TARTRATE 12.5 MG HALF TABLET
12.5000 mg | ORAL_TABLET | Freq: Two times a day (BID) | ORAL | Status: DC
  Administered 2017-07-15 – 2017-07-17 (×4): 12.5 mg via ORAL
  Filled 2017-07-15 (×4): qty 1

## 2017-07-15 MED ORDER — SODIUM CHLORIDE 0.9 % IV SOLN: 250.0000 mL | INTRAVENOUS | Status: DC | PRN

## 2017-07-15 MED ORDER — ACETAMINOPHEN 325 MG PO TABS
650.0000 mg | ORAL_TABLET | Freq: Four times a day (QID) | ORAL | Status: DC | PRN
  Administered 2017-07-17: 650 mg via ORAL
  Filled 2017-07-15: qty 2

## 2017-07-15 MED ORDER — SODIUM CHLORIDE 0.9% FLUSH: 3.0000 mL | INTRAVENOUS | Status: DC | PRN

## 2017-07-15 MED ORDER — ONDANSETRON HCL 4 MG PO TABS: 4.0000 mg | ORAL_TABLET | Freq: Four times a day (QID) | ORAL | Status: DC | PRN

## 2017-07-15 MED ORDER — SODIUM CHLORIDE 0.9% FLUSH
3.0000 mL | Freq: Two times a day (BID) | INTRAVENOUS | Status: DC
  Administered 2017-07-15 – 2017-07-16 (×3): 3 mL via INTRAVENOUS

## 2017-07-15 MED ORDER — OXYCODONE HCL 5 MG PO TABS: 5.0000 mg | ORAL_TABLET | ORAL | Status: DC | PRN

## 2017-07-15 MED ORDER — ONDANSETRON HCL 4 MG/2ML IJ SOLN: 4.0000 mg | Freq: Four times a day (QID) | INTRAMUSCULAR | Status: DC | PRN

## 2017-07-15 MED ORDER — ALLOPURINOL 100 MG PO TABS
100.0000 mg | ORAL_TABLET | Freq: Every day | ORAL | Status: DC
  Administered 2017-07-15 – 2017-07-17 (×3): 100 mg via ORAL
  Filled 2017-07-15 (×3): qty 1

## 2017-07-15 MED ORDER — TRAMADOL HCL 50 MG PO TABS
50.0000 mg | ORAL_TABLET | ORAL | Status: DC | PRN
  Administered 2017-07-15 – 2017-07-16 (×4): 100 mg via ORAL
  Filled 2017-07-15 (×4): qty 2

## 2017-07-15 MED ORDER — PANTOPRAZOLE SODIUM 40 MG PO TBEC
40.0000 mg | DELAYED_RELEASE_TABLET | Freq: Every day | ORAL | Status: DC
  Administered 2017-07-16 – 2017-07-17 (×2): 40 mg via ORAL
  Filled 2017-07-15 (×2): qty 1

## 2017-07-15 MED ORDER — MOVING RIGHT ALONG BOOK
Freq: Once | Status: AC
  Administered 2017-07-15: 23:00:00
  Filled 2017-07-15: qty 1

## 2017-07-15 MED ORDER — ASPIRIN EC 325 MG PO TBEC
325.0000 mg | DELAYED_RELEASE_TABLET | Freq: Every day | ORAL | Status: DC
  Administered 2017-07-16 – 2017-07-17 (×2): 325 mg via ORAL
  Filled 2017-07-15 (×2): qty 1

## 2017-07-15 NOTE — Progress Notes (Signed)
3 Days Post-Op Procedure(s) (LRB): CORONARY ARTERY BYPASS GRAFTING (CABG)X3, USING LEFT INTERNAL MAMMARY ARTERY AND RIGHT GREATER SAPHENOUS VEIN ENDOSCOPIC VESSEL HARVESTING, TEE (N/A) TRANSESOPHAGEAL ECHOCARDIOGRAM (TEE) (N/A) Subjective: Sore but overall feels well. Ambulating well  Objective: Vital signs in last 24 hours: Temp:  [98 F (36.7 C)-99.6 F (37.6 C)] 99.6 F (37.6 C) (12/10 0753) Pulse Rate:  [97] 97 (12/10 0753) Cardiac Rhythm: Normal sinus rhythm (12/09 2000) Resp:  [14-30] 23 (12/10 0753) BP: (90-135)/(55-96) 120/77 (12/10 0600) SpO2:  [91 %-97 %] 94 % (12/10 0753) Weight:  [103.8 kg (228 lb 13.4 oz)] 103.8 kg (228 lb 13.4 oz) (12/10 0500)  Hemodynamic parameters for last 24 hours:    Intake/Output from previous day: 12/09 0701 - 12/10 0700 In: 500 [P.O.:500] Out: 500 [Urine:500] Intake/Output this shift: No intake/output data recorded.  General appearance: alert and cooperative Neurologic: intact Heart: regular rate and rhythm, S1, S2 normal, no murmur, click, rub or gallop Lungs: clear to auscultation bilaterally Abdomen: soft, non-tender; bowel sounds normal; no masses,  no organomegaly Extremities: extremities normal, atraumatic, no cyanosis or edema Wound: dressings dry  Lab Results: Recent Labs    07/13/17 1700 07/13/17 1717 07/14/17 0411  WBC 10.8*  --  10.7*  HGB 9.8* 9.9* 9.3*  HCT 29.4* 29.0* 28.4*  PLT 144*  --  129*   BMET:  Recent Labs    07/13/17 0334  07/13/17 1717 07/14/17 0411  NA 134*  --  139 137  K 4.3  --  3.7 4.3  CL 104  --  101 104  CO2 22  --   --  26  GLUCOSE 119*  --  136* 130*  BUN 16  --  12 13  CREATININE 0.96   < > 1.00 0.97  CALCIUM 8.3*  --   --  8.6*   < > = values in this interval not displayed.    PT/INR:  Recent Labs    07/12/17 1245  LABPROT 15.3*  INR 1.22   ABG    Component Value Date/Time   PHART 7.366 07/12/2017 1823   HCO3 20.0 07/12/2017 1823   TCO2 24 07/13/2017 1717   ACIDBASEDEF 5.0 (H) 07/12/2017 1823   O2SAT 99.0 07/12/2017 1823   CBG (last 3)  Recent Labs    07/13/17 0805 07/13/17 1134 07/13/17 1624  GLUCAP 128* 153* 118*    Assessment/Plan: S/P Procedure(s) (LRB): CORONARY ARTERY BYPASS GRAFTING (CABG)X3, USING LEFT INTERNAL MAMMARY ARTERY AND RIGHT GREATER SAPHENOUS VEIN ENDOSCOPIC VESSEL HARVESTING, TEE (N/A) TRANSESOPHAGEAL ECHOCARDIOGRAM (TEE) (N/A)  POD 3  He remains hemodynamically stable in sinus rhythm  Weight is back to baseline.  Will transfer to 4 E and continue mobilization.   LOS: 3 days    Alleen BorneBryan K Jlynn Ly 07/15/2017

## 2017-07-16 MED ORDER — LACTULOSE 10 GM/15ML PO SOLN
20.0000 g | Freq: Once | ORAL | Status: AC
  Administered 2017-07-16: 20 g via ORAL
  Filled 2017-07-16: qty 30

## 2017-07-16 NOTE — Progress Notes (Signed)
Pacing wire taken out per MD order. Patient tolerated   07/16/17 1218  Vitals  Temp 98.6 F (37 C)  Temp Source Oral  BP 121/82  MAP (mmHg) 92  BP Location Left Arm  BP Method Automatic  Patient Position (if appropriate) Sitting  Pulse Rate 87  Pulse Rate Source Monitor  ECG Heart Rate 89  Cardiac Rhythm NSR  Resp (!) 22  Oxygen Therapy  SpO2 100 %   procedure very well. Vital signs stable. Will continue to monitor.

## 2017-07-16 NOTE — Plan of Care (Signed)
Patient able to ambulate in hall without difficulty.  No complaints of chest pain, patient progressing and meeting milestones appropriately.

## 2017-07-16 NOTE — Progress Notes (Signed)
4 Days Post-Op Procedure(s) (LRB): CORONARY ARTERY BYPASS GRAFTING (CABG)X3, USING LEFT INTERNAL MAMMARY ARTERY AND RIGHT GREATER SAPHENOUS VEIN ENDOSCOPIC VESSEL HARVESTING, TEE (N/A) TRANSESOPHAGEAL ECHOCARDIOGRAM (TEE) (N/A) Subjective: No complaints. Has not had a BM yet   Objective: Vital signs in last 24 hours: Temp:  [97.6 F (36.4 C)-99.5 F (37.5 C)] 97.6 F (36.4 C) (12/11 0740) Pulse Rate:  [93-94] 93 (12/10 1717) Cardiac Rhythm: Normal sinus rhythm (12/11 0800) Resp:  [16-27] 23 (12/11 1100) BP: (105-126)/(63-82) 117/78 (12/11 0300) SpO2:  [93 %-100 %] 100 % (12/11 0800) Weight:  [103.6 kg (228 lb 6.3 oz)] 103.6 kg (228 lb 6.3 oz) (12/11 0500)  Hemodynamic parameters for last 24 hours:    Intake/Output from previous day: 12/10 0701 - 12/11 0700 In: 900 [P.O.:900] Out: -  Intake/Output this shift: No intake/output data recorded.  General appearance: alert and cooperative Neurologic: intact Heart: regular rate and rhythm, S1, S2 normal, no murmur, click, rub or gallop Lungs: clear to auscultation bilaterally Extremities: extremities normal, atraumatic, no cyanosis or edema Wound: incision ok  Lab Results: Recent Labs    07/13/17 1700 07/13/17 1717 07/14/17 0411  WBC 10.8*  --  10.7*  HGB 9.8* 9.9* 9.3*  HCT 29.4* 29.0* 28.4*  PLT 144*  --  129*   BMET:  Recent Labs    07/13/17 1717 07/14/17 0411  NA 139 137  K 3.7 4.3  CL 101 104  CO2  --  26  GLUCOSE 136* 130*  BUN 12 13  CREATININE 1.00 0.97  CALCIUM  --  8.6*    PT/INR: No results for input(s): LABPROT, INR in the last 72 hours. ABG    Component Value Date/Time   PHART 7.366 07/12/2017 1823   HCO3 20.0 07/12/2017 1823   TCO2 24 07/13/2017 1717   ACIDBASEDEF 5.0 (H) 07/12/2017 1823   O2SAT 99.0 07/12/2017 1823   CBG (last 3)  Recent Labs    07/13/17 1134 07/13/17 1624  GLUCAP 153* 118*    Assessment/Plan: S/P Procedure(s) (LRB): CORONARY ARTERY BYPASS GRAFTING (CABG)X3,  USING LEFT INTERNAL MAMMARY ARTERY AND RIGHT GREATER SAPHENOUS VEIN ENDOSCOPIC VESSEL HARVESTING, TEE (N/A) TRANSESOPHAGEAL ECHOCARDIOGRAM (TEE) (N/A)  POD 4  He remains hemodynamically stable in sinus rhythm  DC pacing wires  Wt is at preop.  No BM yet. Give a dose of lactulose.  Awaiting bed on 4E  Plan home tomorrow.   LOS: 4 days    Alleen BorneBryan K Terrall Bley 07/16/2017

## 2017-07-16 NOTE — Discharge Instructions (Signed)
Endoscopic Saphenous Vein Harvesting, Care After °Refer to this sheet in the next few weeks. These instructions provide you with information about caring for yourself after your procedure. Your health care provider may also give you more specific instructions. Your treatment has been planned according to current medical practices, but problems sometimes occur. Call your health care provider if you have any problems or questions after your procedure. °What can I expect after the procedure? °After the procedure, it is common to have: °· Pain. °· Bruising. °· Swelling. °· Numbness. ° °Follow these instructions at home: °Medicine °· Take over-the-counter and prescription medicines only as told by your health care provider. °· Do not drive or operate heavy machinery while taking prescription pain medicine. °Incision care ° °· Follow instructions from your health care provider about how to take care of the cut made during surgery (incision). Make sure you: °? Wash your hands with soap and water before you change your bandage (dressing). If soap and water are not available, use hand sanitizer. °? Change your dressing as told by your health care provider. °? Leave stitches (sutures), skin glue, or adhesive strips in place. These skin closures may need to be in place for 2 weeks or longer. If adhesive strip edges start to loosen and curl up, you may trim the loose edges. Do not remove adhesive strips completely unless your health care provider tells you to do that. °· Check your incision area every day for signs of infection. Check for: °? More redness, swelling, or pain. °? More fluid or blood. °? Warmth. °? Pus or a bad smell. °General instructions °· Raise (elevate) your legs above the level of your heart while you are sitting or lying down. °· Do any exercises your health care providers have given you. These may include deep breathing, coughing, and walking exercises. °· Do not shower, take baths, swim, or use a hot tub  unless told by your health care provider. °· Wear your elastic stocking if told by your health care provider. °· Keep all follow-up visits as told by your health care provider. This is important. °Contact a health care provider if: °· Medicine does not help your pain. °· Your pain gets worse. °· You have new leg bruises or your leg bruises get bigger. °· You have a fever. °· Your leg feels numb. °· You have more redness, swelling, or pain around your incision. °· You have more fluid or blood coming from your incision. °· Your incision feels warm to the touch. °· You have pus or a bad smell coming from your incision. °Get help right away if: °· Your pain is severe. °· You develop pain, tenderness, warmth, redness, or swelling in any part of your leg. °· You have chest pain. °· You have trouble breathing. °This information is not intended to replace advice given to you by your health care provider. Make sure you discuss any questions you have with your health care provider. °Document Released: 04/04/2011 Document Revised: 12/29/2015 Document Reviewed: 06/06/2015 °Elsevier Interactive Patient Education © 2018 [employer] °Coronary Artery Bypass Grafting, Care After °These instructions give you information on caring for yourself after your procedure. Your doctor may also give you more specific instructions. Call your doctor if you have any problems or questions after your procedure. °Follow these instructions at home: °· Only take medicine as told by your doctor. Take medicines exactly as told. Do not stop taking medicines or start any new medicines without talking to your doctor first. °·   Take your pulse as told by your doctor. °· Do deep breathing as told by your doctor. Use your breathing device (incentive spirometer), if given, to practice deep breathing several times a day. Support your chest with a pillow or your arms when you take deep breaths or cough. °· Keep the area clean, dry, and protected where the  surgery cuts (incisions) were made. Remove bandages (dressings) only as told by your doctor. If strips were applied to surgical area, do not take them off. They fall off on their own. °· Check the surgery area daily for puffiness (swelling), redness, or leaking fluid. °· If surgery cuts were made in your legs: °? Avoid crossing your legs. °? Avoid sitting for long periods of time. Change positions every 30 minutes. °? Raise your legs when you are sitting. Place them on pillows. °· Wear stockings that help keep blood clots from forming in your legs (compression stockings). °· Only take sponge baths until your doctor says it is okay to take showers. Pat the surgery area dry. Do not rub the surgery area with a washcloth or towel. Do not bathe, swim, or use a hot tub until your doctor says it is okay. °· Eat foods that are high in fiber. These include raw fruits and vegetables, whole grains, beans, and nuts. Choose lean meats. Avoid canned, processed, and fried foods. °· Drink enough fluids to keep your pee (urine) clear or pale yellow. °· Weigh yourself every day. °· Rest and limit activity as told by your doctor. You may be told to: °? Stop any activity if you have chest pain, shortness of breath, changes in heartbeat, or dizziness. Get help right away if this happens. °? Move around often for short amounts of time or take short walks as told by your doctor. Gradually become more active. You may need help to strengthen your muscles and build endurance. °? Avoid lifting, pushing, or pulling anything heavier than 10 pounds (4.5 kg) for at least 6 weeks after surgery. °· Do not drive until your doctor says it is okay. °· Ask your doctor when you can go back to work. °· Ask your doctor when you can begin sexual activity again. °· Follow up with your doctor as told. °Contact a doctor if: °· You have puffiness, redness, more pain, or fluid draining from the incision site. °· You have a fever. °· You have puffiness in your  ankles or legs. °· You have pain in your legs. °· You gain 2 or more pounds (0.9 kg) a day. °· You feel sick to your stomach (nauseous) or throw up (vomit). °· You have watery poop (diarrhea). °Get help right away if: °· You have chest pain that goes to your jaw or arms. °· You have shortness of breath. °· You have a fast or irregular heartbeat. °· You notice a "clicking" in your breastbone when you move. °· You have numbness or weakness in your arms or legs. °· You feel dizzy or light-headed. °This information is not intended to replace advice given to you by your health care provider. Make sure you discuss any questions you have with your health care provider. °Document Released: 07/28/2013 Document Revised: 12/29/2015 Document Reviewed: 12/30/2012 °Elsevier Interactive Patient Education © 2017 Elsevier Inc. ° °

## 2017-07-16 NOTE — Discharge Summary (Addendum)
Physician Discharge Summary  Patient ID: Daniel Waters MRN: [id number] DOB/AGE: 02/08/1956 61 y.o.  Admit date: 07/12/2017 Discharge date: 07/17/2017  Admission Diagnoses:  . Coronary Artery Disease    abnormal exercise cardiac perfusion study, CATH 06/24/17  . Chest Pain    Discharge Diagnoses:  Active Problems:   S/P CABG x 3  Patient Active Problem List   Diagnosis Date Noted  . S/P CABG x 3 07/12/2017  . Abnormal exercise myocardial perfusion study 06/05/2017  . Palpitation 05/22/2017  . Essential hypertension 05/22/2017  . Vitamin D deficiency 05/20/2017  . Chest pain 05/20/2017  . Conductive hearing loss of both ears 05/01/2017  . Hypertriglyceridemia 04/17/2017  . History of gout 12/03/2016    HPI:  The patient is a 61 year old gentleman with a long history of hypertriglyceridemia, hypertension and remote smoking who had no prior cardiac history and is very active exercising vigorously several days per week. He mentioned to his PCP that he had some pain in his left arm with activity recently and had an ECG that was abnormal. He was seen by Dr. Dulce SellarMunley and had a exercise myocardial perfusion study that was a high risk study with markedly abnormal ECG changes prompting discontinuation of the study. His EF was 48%. There was a medium defect of moderate severity in the apical lateral walls consistent with ischemia. He underwent cardiac cath on 06/24/2017 showing severe 2 vessel CAD with a complex calcified proximal to mid LAD stenosis of 70% with a markedly abnormal FFR of 0.73. The D1 had 99% stenosis at a bifurcation. The RCA was diffusely diseased with calcific plaque with an 85% mid vessel stenosis. LV function was normal with normal LVEDP. The patient says that he has not been doing any exercise since his cath and has had no chest or arm pain. He denies shortness of breath and feels like his stamina is normal. He is somewhat in shock about the diagnosis.    Discharged  Condition: good  Hospital Course: Patient was admitted after the procedure and where he underwent the below described procedure.  He tolerated it well was taken to the surgical intensive care unit in stable condition.  Postoperative hospital course:  Overall the patient has progressed quite nicely.  He was extubated from the ventilator using standard protocols without difficulty.  He has remained hemodynamically stable in sinus rhythm.  He does have an expected acute blood loss anemia and values have stabilized.  Most recent hemoglobin and hematocrit are 9.3/28.4.  He had a mild postoperative volume overload responded well to diuretics.  He is now at his preop weight.  All routine lines, monitors and drainage devices have been discontinued in the standard fashion.  He is tolerating gradually increasing activities using standard protocols.  Incisions are healing well without he is tolerating diet.  Oxygen has been weaned and he maintains good saturations on room air.  At time of discharge the patient is felt to be quite stable.  Consults: None  Significant Diagnostic Studies: routine post op labs and serial CXR's  Treatments: surgery:  CARDIOVASCULAR SURGERY OPERATIVE NOTE  07/12/2017  Surgeon:  Alleen BorneBryan K. Bartle, MD  First Assistant: Gershon CraneWayne Gold,  PA-C   Preoperative Diagnosis:  Severe multi-vessel coronary artery disease   Postoperative Diagnosis:  Same   Procedure:  1. Median Sternotomy 2. Extracorporeal circulation 3.   Coronary artery bypass grafting x 3   Left internal mammary graft to the LAD  SVG to Diagonal  SVG to PDA  4.   Endoscopic vein harvest from the right leg   Anesthesia:  General Endotracheal   Clinical History/Surgical Indication:   Discharge Exam: Blood pressure 128/79, pulse 89, temperature 98.2 F (36.8 C), temperature source Oral, resp. rate  (!) 23, height 6\' 1"  (1.854 m), weight 226 lb 13.7 oz (102.9 kg), SpO2 99 %.   General appearance: alert, cooperative and no distress Heart: regular rate and rhythm Lungs: clear to auscultation bilaterally Abdomen: benign Extremities: no edema Wound: incis healing well   Disposition: 01-Home or Self Care  Discharge Instructions    Discharge patient   Complete by:  As directed    Discharge disposition:  01-Home or Self Care   Discharge patient date:  07/17/2017   Discharge patient   Complete by:  As directed    Discharge disposition:  01-Home or Self Care   Discharge patient date:  07/17/2017     Allergies as of 07/17/2017      Reactions   Sulfur Nausea And Vomiting, Rash   INTOLERANCE >  NAUSEA & VOMITING      Medication List    STOP taking these medications   aspirin 81 MG chewable tablet Replaced by:  aspirin 325 MG EC tablet   lisinopril 5 MG tablet Commonly known as:  PRINIVIL,ZESTRIL   nitroGLYCERIN 0.4 MG SL tablet Commonly known as:  NITROSTAT     TAKE these medications   acetaminophen 500 MG tablet Commonly known as:  TYLENOL Take 500 mg by mouth daily as needed for moderate pain.   allopurinol 100 MG tablet Commonly known as:  ZYLOPRIM Take 1 tablet (100 mg total) by mouth daily.   aspirin 325 MG EC tablet Take 1 tablet (325 mg total) by mouth daily. Replaces:  aspirin 81 MG chewable tablet   atorvastatin 40 MG tablet Commonly known as:  LIPITOR Take 40 mg by mouth daily.   B-12 2500 MCG Tabs Take 2,500 mcg by mouth daily.   diphenhydramine-acetaminophen 25-500 MG Tabs tablet Commonly known as:  TYLENOL PM Take 1 tablet by mouth at bedtime as needed (sleep).   fenofibrate 145 MG tablet Commonly known as:  TRICOR Take 145 mg by mouth daily.   Krill Oil 350 MG Caps Take 350 mg by mouth daily.   LORazepam 1 MG tablet Commonly known as:  ATIVAN Take 1 tablet (1 mg total) by mouth as needed for anxiety. Take 1 tablet by mouth daily as  needed for anxiety.   metoprolol tartrate 25 MG tablet Commonly known as:  LOPRESSOR Take 1 tablet (25 mg total) by mouth 2 (two) times daily.   traMADol 50 MG tablet Commonly known as:  ULTRAM Take 1 tablet (50 mg total) by mouth every 4 (four) hours as needed for moderate pain.      Follow-up Information    Alleen BorneBartle, [name], MD Follow up.   Specialty:  Cardiothoracic Surgery Why:  Appointment to see Dr. Virgel GessBartell on 08/14/2017 at 12:30 PM.  [facility].  [facility] is located in the  same office first floor. Contact information: [address] [place] [address] [phone]        [facility] Follow up.   Specialty:  Cardiothoracic Surgery Why:  Appointment with a nurse on 07/24/2017 at 10:30 AM for suture removal. Contact information: [address] [place], [address] [place] [address] [phone]       [place], Peter M, MD Follow up.   Specialty:  Cardiology Contact information: [address] [place] [address] [phone]          The patient has been discharged on:   1.Beta Blocker:  Yes [ y  ]                              No   [   ]                              If No, reason:  2.Ace Inhibitor/ARB: Yes [   ]                                     No  [  n  ]                                     If No, reason:BP too low to initiate currently  3.Statin:   Yes Cove.Etienne   ]                  No  [   ]                  If No, reason:  4.Ecasa:  Yes  Cove.Etienne   ]                  No   [   ]                  If No, reason:  Signed: Sharlene Dory 07/17/2017, 8:43 AM

## 2017-07-17 MED ORDER — OXYCODONE HCL 5 MG PO TABS: 5.0000 mg | ORAL_TABLET | Freq: Four times a day (QID) | ORAL | 0 refills | Status: DC | PRN

## 2017-07-17 MED ORDER — TRAMADOL HCL 50 MG PO TABS: 50.0000 mg | ORAL_TABLET | ORAL | 0 refills | Status: DC | PRN

## 2017-07-17 MED ORDER — ASPIRIN 325 MG PO TBEC: 325.0000 mg | DELAYED_RELEASE_TABLET | Freq: Every day | ORAL | Status: DC

## 2017-07-17 MED FILL — Potassium Chloride Inj 2 mEq/ML: INTRAVENOUS | Qty: 20 | Status: AC

## 2017-07-17 MED FILL — Magnesium Sulfate Inj 50%: INTRAMUSCULAR | Qty: 10 | Status: AC

## 2017-07-17 MED FILL — Heparin Sodium (Porcine) Inj 1000 Unit/ML: INTRAMUSCULAR | Qty: 30 | Status: AC

## 2017-07-17 NOTE — Progress Notes (Addendum)
Pt has been walking independently. Actually walked to 4W. No c/o. Ed completed with pt and wife. Good reception. Eager to do [facility] and [facility]. Cannot write official order for Dr. Dulce SellarMunley. [id number] Ethelda ChickKristan Maheen Cwikla CES, ACSM 9:17 AM 07/17/2017

## 2017-07-17 NOTE — Progress Notes (Signed)
      [address]       [place] [id number]             [phone]      5 Days Post-Op Procedure(s) (LRB): CORONARY ARTERY BYPASS GRAFTING (CABG)X3, USING LEFT INTERNAL MAMMARY ARTERY AND RIGHT GREATER SAPHENOUS VEIN ENDOSCOPIC VESSEL HARVESTING, TEE (N/A) TRANSESOPHAGEAL ECHOCARDIOGRAM (TEE) (N/A) Subjective: Feels fine  Objective: Vital signs in last 24 hours: Temp:  [97.6 F (36.4 C)-99 F (37.2 C)] 98.2 F (36.8 C) (12/12 0515) Pulse Rate:  [87-97] 89 (12/12 0515) Cardiac Rhythm: Normal sinus rhythm (12/11 2025) Resp:  [18-23] 23 (12/12 0515) BP: (119-129)/(73-88) 128/79 (12/12 0515) SpO2:  [96 %-100 %] 99 % (12/12 0515) Weight:  [226 lb 13.7 oz (102.9 kg)] 226 lb 13.7 oz (102.9 kg) (12/12 0515)  Hemodynamic parameters for last 24 hours:    Intake/Output from previous day: 12/11 0701 - 12/12 0700 In: 120 [P.O.:120] Out: -  Intake/Output this shift: No intake/output data recorded.  General appearance: alert, cooperative and no distress Heart: regular rate and rhythm Lungs: clear to auscultation bilaterally Abdomen: benign Extremities: no edema Wound: incis healing well  Lab Results: No results for input(s): WBC, HGB, HCT, PLT in the last 72 hours. BMET: No results for input(s): NA, K, CL, CO2, GLUCOSE, BUN, CREATININE, CALCIUM in the last 72 hours.  PT/INR: No results for input(s): LABPROT, INR in the last 72 hours. ABG    Component Value Date/Time   PHART 7.366 07/12/2017 1823   HCO3 20.0 07/12/2017 1823   TCO2 24 07/13/2017 1717   ACIDBASEDEF 5.0 (H) 07/12/2017 1823   O2SAT 99.0 07/12/2017 1823   CBG (last 3)  No results for input(s): GLUCAP in the last 72 hours.  Meds Scheduled Meds: . allopurinol  100 mg Oral Daily  . aspirin EC  325 mg Oral Daily  . atorvastatin  40 mg Oral Daily  . mouth rinse  15 mL Mouth Rinse BID  . metoprolol tartrate  12.5 mg Oral BID  . pantoprazole  40 mg Oral QAC breakfast  . sodium chloride flush   3 mL Intravenous Q12H   Continuous Infusions: . sodium chloride     PRN Meds:.sodium chloride, acetaminophen, levalbuterol, LORazepam, ondansetron **OR** ondansetron (ZOFRAN) IV, oxyCODONE, sodium chloride flush, traMADol  Xrays No results found.  Assessment/Plan: S/P Procedure(s) (LRB): CORONARY ARTERY BYPASS GRAFTING (CABG)X3, USING LEFT INTERNAL MAMMARY ARTERY AND RIGHT GREATER SAPHENOUS VEIN ENDOSCOPIC VESSEL HARVESTING, TEE (N/A) TRANSESOPHAGEAL ECHOCARDIOGRAM (TEE) (N/A) Plan for discharge: see discharge orders Some tachy- will increase beta blocker at discharge Routine OTC laxatives prn  LOS: 5 days    Daniel Waters 07/17/2017

## 2017-07-18 ENCOUNTER — Other Ambulatory Visit: Payer: BLUE CROSS/BLUE SHIELD

## 2017-07-24 ENCOUNTER — Ambulatory Visit (INDEPENDENT_AMBULATORY_CARE_PROVIDER_SITE_OTHER): Payer: Self-pay

## 2017-07-24 DIAGNOSIS — Z4802 Encounter for removal of sutures: Secondary | ICD-10-CM

## 2017-07-24 NOTE — Progress Notes (Signed)
Patient arrived for nurse visit to remove suture/staples post- procedure.  Suture/staples removed with no signs/ symptoms of infection noted.  Patient tolerated procedure well.  Site cleaned.  Patient/ family instructed to keep the incision sites clean and dry.  Patient/ family acknowledged instructions given.

## 2017-08-01 ENCOUNTER — Ambulatory Visit: Payer: BLUE CROSS/BLUE SHIELD | Admitting: Physician Assistant

## 2017-08-14 ENCOUNTER — Telehealth: Payer: Self-pay | Admitting: Cardiology

## 2017-08-14 ENCOUNTER — Ambulatory Visit: Payer: BLUE CROSS/BLUE SHIELD | Admitting: Surgery

## 2017-08-14 NOTE — Telephone Encounter (Signed)
Wants to know what the purpose of his appt with BJM next week and is it necessary

## 2017-08-14 NOTE — Telephone Encounter (Signed)
Patient wanted to see Dr. Laneta SimmersBartle and Dr. Dulce SellarMunley in the same day. Rescheduled to 08/27/16 at 4:20 pm. Verbalized understanding of new appointment date and time. No further questions.

## 2017-08-21 ENCOUNTER — Ambulatory Visit: Payer: BLUE CROSS/BLUE SHIELD | Admitting: Cardiology

## 2017-08-21 ENCOUNTER — Ambulatory Visit: Payer: BLUE CROSS/BLUE SHIELD | Admitting: Surgery

## 2017-08-23 ENCOUNTER — Telehealth: Payer: Self-pay

## 2017-08-23 NOTE — Telephone Encounter (Signed)
Patient's wife called asking if he could use any creams, lotions, or triple antibiotic ointments on his incision sites because they have started to itch from new hair growth.  I explained the importance of not using anything on the wounds except for showering with soap and water.  Family acknowledged receipt.  Will see patient on their follow-up appointment on 08/27/2017.

## 2017-08-26 ENCOUNTER — Other Ambulatory Visit: Payer: Self-pay | Admitting: Surgery

## 2017-08-26 DIAGNOSIS — I25119 Atherosclerotic heart disease of native coronary artery with unspecified angina pectoris: Secondary | ICD-10-CM

## 2017-08-26 DIAGNOSIS — Z951 Presence of aortocoronary bypass graft: Secondary | ICD-10-CM

## 2017-08-26 HISTORY — DX: Atherosclerotic heart disease of native coronary artery with unspecified angina pectoris: I25.119

## 2017-08-26 NOTE — Progress Notes (Signed)
Cardiology Office Note:    Date:  08/27/2017   ID:  Daniel Waters, DOB 1956-03-28, MRN [id number]  PCP:  Sharlene Dory, DO  Cardiologist:  Norman Herrlich, MD    Referring MD: Sharlene Dory*    ASSESSMENT:    1. Coronary artery disease involving native coronary artery of native heart with angina pectoris (HCC)   2. Essential hypertension   3. Dyslipidemia    PLAN:    In order of problems listed above:  1. Stable he is made a good recovery continue current medical treatment aspirin beta-blocker statin and role cardiac rehabilitation.  He is anemic after bypass I will check a hemoglobin on him with his lightheadedness as well as CMP and lipid profile as baseline postoperatively after recovery 2. Stable continue current antihypertensives 3. Stable continue high intensity statin at this time I would not resume fenofibrate.   Next appointment: 6 months   Medication Adjustments/Labs and Tests Ordered: Current medicines are reviewed at length with the patient today.  Concerns regarding medicines are outlined above.  Orders Placed This Encounter  Procedures  . CBC  . Lipid Panel w/o Chol/HDL Ratio  . Comprehensive Metabolic Panel (CMET)   No orders of the defined types were placed in this encounter.   Chief Complaint  Patient presents with  . Follow-up    after CABG  . Dizziness    when he stands   . Coronary Artery Disease    History of Present Illness:   Daniel Waters is a 62 y.o. male with a hx of CAD and CABG 07/12/17 with Left internal mammary graft to the LAD, SVG to Diagonal , and SVG to PDA last seen by me 06/09/17.             Compliance with diet, lifestyle and medications: Yes  Spirits are good and he is pleased with the quality of his life he is walking 5-6 miles a day and feels he is not only recovered but he is improved.  If he stretches out he has a little bit of sternal pain but is not requiring narcotics and is better all the time.   No shortness of breath palpitations syncope or TIA.  He was seen by CT surgery today understands return to work as well as a commitment to enroll in cardiac rehabilitation.  Seen by CTS today: Assessment & Plan  Chest x-ray was reviewed with the patient.  There is no pleural effusion or pneumothorax.  The sternal wires are in line.  Patient has been extremely active over the last couple weeks but has kept to his 5 pound upper body limit.  He has maintain sternal precautions.  He has [place].  He is clear today for driving since he has not required any narcotic pain medicine since he left the hospital.  He usually takes 2 Tylenol before bed and that is all he requires.  We discussed a cardiac rehab program which he plans to attend here at [facility].  He is able to be evaluated next week.  He asked for a return to work note for 8 weeks after his surgery.  We discussed returning only 3 days a week until he builds up more stamina and then returning full-time.  I think he would be ready for this transition.  He is aware to take it one day at a time and only do as much as he is capable of.  We discussed allowing rest  days for his body to recover.  He was very concerned today about how long his grafts were the last.  We discussed continuing his current medical regimen as well as a healthy lifestyle which includes much activity and a heart healthy diet.  The patient has lost 8 pounds since he was discharged from the hospital.  He has a goal of weighing 200 pounds.  I think this is very attainable given his drive.  He seems extremely committed to his weight loss journey and living a healthy lifestyle.  We discussed sexual intercourse and that the patient is to remain on the bottom and not putting pressure on his chest.  We do not need to schedule routine follow-up here in the clinic but he is always welcome to come in for an appointment on an as-needed basis.  He has an appointment  with his cardiologist this afternoon.  He is always welcome to call our office for questions or concerns.  All questions today were answered to his and his wife satisfaction. Past Medical History:  Diagnosis Date  . Anxiety   . Arthritis   . Benign essential HTN 05/20/2017  . Chest pain 05/20/2017  . Conductive hearing loss of both ears 05/01/2017  . Coronary artery disease   . History of gout 12/03/2016  . History of kidney stones   . Hypertriglyceridemia 04/17/2017  . Sleep apnea 05/20/2017   does not have per pt  . Vitamin D deficiency 05/20/2017    Past Surgical History:  Procedure Laterality Date  . CARPAL TUNNEL RELEASE    . COLONOSCOPY    . CORONARY ARTERY BYPASS GRAFT N/A 07/12/2017   Procedure: CORONARY ARTERY BYPASS GRAFTING (CABG)X3, USING LEFT INTERNAL MAMMARY ARTERY AND RIGHT GREATER SAPHENOUS VEIN ENDOSCOPIC VESSEL HARVESTING, TEE;  Surgeon: [name], MD;  Location: [facility] OR;  Service: Open Heart Surgery;  Laterality: N/A;  . INTRAVASCULAR PRESSURE WIRE/FFR STUDY N/A 06/24/2017   Procedure: INTRAVASCULAR PRESSURE WIRE/FFR STUDY;  Surgeon: Swaziland, Peter M, MD;  Location: [facility] INVASIVE CV LAB;  Service: Cardiovascular;  Laterality: N/A;  . knee Bilateral   . LEFT HEART CATH AND CORONARY ANGIOGRAPHY N/A 06/24/2017   Procedure: LEFT HEART CATH AND CORONARY ANGIOGRAPHY;  Surgeon: Swaziland, Peter M, MD;  Location: [facility] INVASIVE CV LAB;  Service: Cardiovascular;  Laterality: N/A;  . TEE WITHOUT CARDIOVERSION N/A 07/12/2017   Procedure: TRANSESOPHAGEAL ECHOCARDIOGRAM (TEE);  Surgeon: [name], MD;  Location: [facility] OR;  Service: Open Heart Surgery;  Laterality: N/A;    Current Medications: Current Meds  Medication Sig  . allopurinol (ZYLOPRIM) 100 MG tablet Take 1 tablet (100 mg total) by mouth daily.  Marland Kitchen aspirin EC 325 MG EC tablet Take 1 tablet (325 mg total) by mouth daily.  Marland Kitchen atorvastatin (LIPITOR) 40 MG tablet Take 40 mg by mouth daily.   . metoprolol tartrate (LOPRESSOR)  25 MG tablet Take 1 tablet (25 mg total) by mouth 2 (two) times daily.     Allergies:   Sulfur   Social History   Socioeconomic History  . Marital status: Married    Spouse name: None  . Number of children: None  . Years of education: None  . Highest education level: None  Social Needs  . Financial resource strain: None  . Food insecurity - worry: None  . Food insecurity - inability: None  . Transportation needs - medical: None  . Transportation needs - non-medical: None  Occupational History  . None  Tobacco Use  . Smoking  status: Former Smoker    Packs/day: 1.00    Years: 4.00    Pack years: 4.00    Types: Cigarettes  . Smokeless tobacco: Never Used  . Tobacco comment: SMOKED ONLY IN COLLEGE  Substance and Sexual Activity  . Alcohol use: Yes    Comment: has not had any in 2 months beer and liquor at times  . Drug use: No  . Sexual activity: None  Other Topics Concern  . None  Social History Narrative  . None     Family History: The patient's family history includes Cancer in his paternal grandmother; Heart attack (age of onset: 73) in his father; Stroke i86n his paternal grandfather. ROS:   Please see the history of present illness.    All other systems reviewed and are negative.  EKGs/Labs/Other Studies Reviewed:    The following studies were reviewed today  Recent Labs: 07/10/2017: ALT 19 07/13/2017: Magnesium 1.8 07/14/2017: BUN 13; Creatinine, Ser 0.97; Hemoglobin 9.3; Platelets 129; Potassium 4.3; Sodium 137  Recent Lipid Panel    Component Value Date/Time   CHOL 135 04/17/2017 0818   TRIG (H) 04/17/2017 0818    441.0 Triglyceride is over 400; calculations on Lipids are invalid.   HDL 36.60 (L) 04/17/2017 0818   CHOLHDL 4 04/17/2017 0818   LDLDIRECT 52.0 04/17/2017 0818    Physical Exam:    VS:  BP 118/80 (BP Location: Right Arm, Patient Position: Sitting, Cuff Size: Large)   Pulse 72   Ht 6\' 1"  (1.854 m)   Wt 220 lb 12.8 oz (100.2 kg)   SpO2  99%   BMI 29.13 kg/m     Wt Readings from Last 3 Encounters:  08/27/17 220 lb 12.8 oz (100.2 kg)  08/27/17 219 lb (99.3 kg)  07/17/17 226 lb 13.7 oz (102.9 kg)     GEN:  Well nourished, well developed in no acute distress HEENT: Normal NECK: No JVD; No carotid bruits LYMPHATICS: No lymphadenopathy CARDIAC: RRR, no murmurs, rubs, gallops RESPIRATORY:  Clear to auscultation without rales, wheezing or rhonchi  ABDOMEN: Soft, non-tender, non-distended MUSCULOSKELETAL:  No edema; No deformity  SKIN: Warm and dry NEUROLOGIC:  Alert and oriented x 3 PSYCHIATRIC:  Normal affect    Signed, Norman HerrlichBrian Elishia Kaczorowski, MD  08/27/2017 4:55 PM    [facility]

## 2017-08-27 ENCOUNTER — Other Ambulatory Visit: Payer: Self-pay

## 2017-08-27 ENCOUNTER — Ambulatory Visit (INDEPENDENT_AMBULATORY_CARE_PROVIDER_SITE_OTHER): Payer: BLUE CROSS/BLUE SHIELD | Admitting: Cardiology

## 2017-08-27 ENCOUNTER — Ambulatory Visit
Admission: RE | Admit: 2017-08-27 | Discharge: 2017-08-27 | Disposition: A | Discharge status: Home / Self Care | Payer: BLUE CROSS/BLUE SHIELD | Source: Ambulatory Visit | Attending: Surgery | Admitting: Surgery

## 2017-08-27 ENCOUNTER — Encounter: Payer: Self-pay | Admitting: Physician Assistant

## 2017-08-27 ENCOUNTER — Ambulatory Visit (INDEPENDENT_AMBULATORY_CARE_PROVIDER_SITE_OTHER): Payer: Self-pay | Admitting: Physician Assistant

## 2017-08-27 ENCOUNTER — Encounter: Payer: Self-pay | Admitting: Cardiology

## 2017-08-27 VITALS — BP 118/80 | HR 72 | Ht 73.0 in | Wt 220.8 lb

## 2017-08-27 VITALS — BP 111/77 | HR 80 | Resp 20 | Ht 73.0 in | Wt 219.0 lb

## 2017-08-27 DIAGNOSIS — I25119 Atherosclerotic heart disease of native coronary artery with unspecified angina pectoris: Secondary | ICD-10-CM

## 2017-08-27 DIAGNOSIS — E785 Hyperlipidemia, unspecified: Secondary | ICD-10-CM | POA: Diagnosis not present

## 2017-08-27 DIAGNOSIS — Z951 Presence of aortocoronary bypass graft: Secondary | ICD-10-CM

## 2017-08-27 DIAGNOSIS — I10 Essential (primary) hypertension: Secondary | ICD-10-CM

## 2017-08-27 NOTE — Patient Instructions (Addendum)
You may return to driving an automobile as long as you are no longer requiring oral narcotic pain relievers during the daytime.  It would be wise to start driving only short distances during the daylight and gradually increase from there as you feel comfortable.  You are encouraged to enroll and participate in the outpatient cardiac rehab program beginning as soon as practical.  Make every effort to stay physically active, get some type of exercise on a regular basis, and stick to a "heart healthy diet".  The long term benefits for regular exercise and a healthy diet are critically important to your overall health and wellbeing.  Please follow-up with our office as needed.

## 2017-08-27 NOTE — Progress Notes (Signed)
Daniel Waters is a 62 y.o. male patient returns to the office for his routine 4-week follow-up appointment status post coronary artery bypass grafting x3 with Dr. Laneta SimmersBartle.  He had an uneventful stay in the hospital and was discharged on postop day 5.   1. S/P CABG x 3   2. Coronary artery disease involving native coronary artery of native heart with angina pectoris [facility](HCC)    Past Medical History:  Diagnosis Date  . Anxiety   . Arthritis   . Benign essential HTN 05/20/2017  . Chest pain 05/20/2017  . Conductive hearing loss of both ears 05/01/2017  . Coronary artery disease   . History of gout 12/03/2016  . History of kidney stones   . Hypertriglyceridemia 04/17/2017  . Sleep apnea 05/20/2017   does not have per pt  . Vitamin D deficiency 05/20/2017   No past surgical history pertinent negatives on file. Scheduled Meds: Current Outpatient Medications on File Prior to Visit  Medication Sig Dispense Refill  . allopurinol (ZYLOPRIM) 100 MG tablet Take 1 tablet (100 mg total) by mouth daily. 30 tablet 6  . aspirin EC 325 MG EC tablet Take 1 tablet (325 mg total) by mouth daily.    Marland Kitchen. atorvastatin (LIPITOR) 40 MG tablet Take 40 mg by mouth daily.     . metoprolol tartrate (LOPRESSOR) 25 MG tablet Take 1 tablet (25 mg total) by mouth 2 (two) times daily. 60 tablet 3  . tamsulosin (FLOMAX) 0.4 MG CAPS capsule Take 0.4 mg by mouth as needed.    . traMADol (ULTRAM) 50 MG tablet Take 1 tablet (50 mg total) by mouth every 4 (four) hours as needed for moderate pain. 30 tablet 0   No current facility-administered medications on file prior to visit.     Allergies  Allergen Reactions  . Sulfur Nausea And Vomiting and Rash    INTOLERANCE >  NAUSEA & VOMITING    Blood pressure 111/77, pulse 80, resp. rate 20, height 6\' 1"  (1.854 m), weight 219 lb (99.3 kg), SpO2 98 %.  Subjective Today Daniel Waters presents for his routine 4-week follow-up appointment.  Overall he has been doing quite well.   [place] has [place] the beach for the last couple weeks walking 3-6 miles a day.  He has had a few days throughout his postop course where he was fatigued but these are few and far between.  Most days he is up at 6 AM puts in a full day and gets tired around 8 or 9 PM.  Objective  Cor: Regular rate and rhythm, no murmur Pulmonary: Clear to auscultation in all fields Abdominal: No tenderness Wound: Midsternal incision is clean dry and intact without drainage.  The sternum is stable to palpation.  EVH site on the right leg is clean and dry. Extremity: No edema    CLINICAL DATA:  CABG.  EXAM: CHEST  2 VIEW  COMPARISON:  Chest x-ray 07/14/2017.  FINDINGS: Prior CABG. Heart size normal. No focal infiltrate. No pleural effusion or pneumothorax. No acute bony abnormality.  IMPRESSION: 1.  Prior CABG.  Heart size normal.  No pulmonary venous congestion.  2. No acute pulmonary disease. No focal infiltrate noted on today's exam.   Electronically Signed   By: Maisie Fushomas  Register   On: 08/27/2017 14:27  Assessment & Plan  Chest x-ray was reviewed with the patient.  There is no pleural effusion or pneumothorax.  The sternal wires are in line.  Patient has been extremely active over  the last couple weeks but has kept to his 5 pound upper body limit.  He has maintain sternal precautions.  He has continued a very active lifestyle [place].  He is clear today for driving since he has not required any narcotic pain medicine since he left the hospital.  He usually takes 2 Tylenol before bed and that is all he requires.  We discussed a cardiac rehab program which he plans to attend here at [facility].  He is able to be evaluated next week.  He asked for a return to work note for 8 weeks after his surgery.  We discussed returning only 3 days a week until he builds up more stamina and then returning full-time.  I think he would be ready for this transition.  He is aware to take it one day at a time  and only do as much as he is capable of.  We discussed allowing rest days for his body to recover.  He was very concerned today about how long his grafts were the last.  We discussed continuing his current medical regimen as well as a healthy lifestyle which includes much activity and a heart healthy diet.  The patient has lost 8 pounds since he was discharged from the hospital.  He has a goal of weighing 200 pounds.  I think this is very attainable given his drive.  He seems extremely committed to his weight loss journey and living a healthy lifestyle.  We discussed sexual intercourse and that the patient is to remain on the bottom and not putting pressure on his chest.  We do not need to schedule routine follow-up here in the clinic but he is always welcome to come in for an appointment on an as-needed basis.  He has an appointment with his cardiologist this afternoon.  He is always welcome to call our office for questions or concerns.  All questions today were answered to his and his wife satisfaction.  Sharlene Dory 08/27/2017

## 2017-08-27 NOTE — Patient Instructions (Addendum)
Medication Instructions:  Your physician recommends that you continue on your current medications as directed. Please refer to the Current Medication list given to you today.  Labwork: Your physician recommends that you return for lab work in: today. CMP, CBC, lipid  Testing/Procedures: None  Follow-Up: Your physician wants you to follow-up in: 6 months. You will receive a reminder letter in the mail two months in advance. If you don't receive a letter, please call our office to schedule the follow-up appointment.  Any Other Special Instructions Will Be Listed Below (If Applicable).     If you need a refill on your cardiac medications before your next appointment, please call your pharmacy.         Mediterranean diet: A heart-healthy eating plan The heart-healthy Mediterranean diet is a healthy eating plan based on typical foods and recipes of Mediterranean-style cooking. Here's how to adopt the Mediterranean diet. By [facility] Staff  If you're looking for a heart-healthy eating plan, the Mediterranean diet might be right for you. The Mediterranean diet incorporates the basics of healthy eating - plus a splash of flavorful olive oil and perhaps a glass of red wine - among other components characterizing the traditional cooking style of countries bordering the [employer]. Most healthy diets include fruits, vegetables, fish and whole grains, and limit unhealthy fats. While these parts of a healthy diet are tried-and-true, subtle variations or differences in proportions of certain foods may make a difference in your risk of heart disease.  Benefits of the Mediterranean diet Research has shown that the traditional Mediterranean diet reduces the risk of heart disease. The diet has been associated with a lower level of oxidized low-density lipoprotein (LDL) cholesterol - the "bad" cholesterol that's more likely to build up deposits in your arteries. In fact, a meta-analysis of  more than 1.5 million healthy adults demonstrated that following a Mediterranean diet was associated with a reduced risk of cardiovascular mortality as well as overall mortality. The Mediterranean diet is also associated with a reduced incidence of cancer, and Parkinson's and Alzheimer's diseases. Women who eat a Mediterranean diet supplemented with extra-virgin olive oil and mixed nuts may have a reduced risk of breast cancer. For these reasons, most if not all major scientific organizations encourage healthy adults to adapt a style of eating like that of the Mediterranean diet for prevention of major chronic diseases.  Key components of the Mediterranean diet The Mediterranean diet emphasizes: Eating primarily plant-based foods, such as fruits and vegetables, whole grains, legumes and nuts  Replacing butter with healthy fats such as olive oil and canola oil  Using herbs and spices instead of salt to flavor foods  Limiting red meat to no more than a few times a month  Eating fish and poultry at least twice a week  Enjoying meals with family and friends  Drinking red wine in moderation (optional)  Getting plenty of exercise Fruits, vegetables, nuts and grains  The Mediterranean diet traditionally includes fruits, vegetables, pasta and rice. For example, residents of [place] eat very little red meat and average nine servings a day of antioxidant-rich fruits and vegetables.  Grains in the Mediterranean region are typically whole grain and usually contain very few unhealthy trans fats, and bread is an important part of the diet there. However, throughout the Mediterranean region, bread is eaten plain or dipped in olive oil - not eaten with butter or margarines, which contain saturated or trans fats.  Nuts are another part of a healthy Mediterranean  diet. Nuts are high in fat (approximately 80 percent of their calories come from fat), but most of the fat is not saturated. Because nuts are high in  calories, they should not be eaten in large amounts - generally no more than a handful a day. Avoid candied or honey-roasted and heavily salted nuts.  Healthy fats The focus of the Mediterranean diet isn't on limiting total fat consumption, but rather to make wise choices about the types of fat you eat. The Mediterranean diet discourages saturated fats and hydrogenated oils (trans fats), both of which contribute to heart disease. The Mediterranean diet features olive oil as the primary source of fat. Olive oil provides monounsaturated fat - a type of fat that can help reduce LDL cholesterol levels when used in place of saturated or trans fats.  "Extra-virgin" and "virgin" olive oils - the least processed forms - also contain the highest levels of the protective plant compounds that provide antioxidant effects. Monounsaturated fats and polyunsaturated fats, such as canola oil and some nuts, contain the beneficial linolenic acid (a type of omega-3 fatty acid). Omega-3 fatty acids lower triglycerides, decrease blood clotting, are associated with decreased sudden heart attack, improve the health of your blood vessels, and help moderate blood pressure. Fatty fish - such as mackerel, lake trout, herring, sardines, albacore tuna and salmon - are rich sources of omega-3 fatty acids. Fish is eaten on a regular basis in the Mediterranean diet.  Wine The health effects of alcohol have been debated for many years, and some doctors are reluctant to encourage alcohol consumption because of the health consequences of excessive drinking. However, alcohol - in moderation - has been associated with a reduced risk of heart disease in some research studies. The Mediterranean diet typically includes a moderate amount of wine. This means no more than 5 ounces (148 milliliters) of wine daily for women (or men over age 67), and no more than 10 ounces (296 milliliters) of wine daily for men under age 28. If you're unable to limit  your alcohol intake to the amounts defined above, if you have a personal or family history of alcohol abuse, or if you have heart or liver disease, refrain from drinking wine or any other alcohol.  Putting it all together The Mediterranean diet is a delicious and healthy way to eat. Many people who switch to this style of eating say they'll never eat any other way. Here are some specific steps to get you started: Eat your veggies and fruits - and switch to whole grains. An abundance and variety of plant foods should make up the majority of your meals. Strive for seven to 10 servings a day of veggies and fruits. Switch to whole-grain bread and cereal, and begin to eat more whole-grain rice and pasta products.  Go nuts. Keep almonds, cashews, pistachios and walnuts on hand for a quick snack. Choose natural peanut butter, rather than the kind with hydrogenated fat added. Try tahini (blended sesame seeds) as a dip or spread for bread.  Pass on the butter. Try olive or canola oil as a healthy replacement for butter or margarine. Use it in cooking. Dip bread in flavored olive oil or lightly spread it on whole-grain bread for a tasty alternative to butter. Or try tahini as a dip or spread.  Spice it up. Herbs and spices make food tasty and are also rich in health-promoting substances. Season your meals with herbs and spices rather than salt.  Go fish. Eat fish once  or twice a week. Fresh or water-packed tuna, salmon, trout, mackerel and herring are healthy choices. Grilled fish tastes good and requires little cleanup. Avoid fried fish, unless it's sauteed in a small amount of canola oil.  Rein in the red meat. Substitute fish and poultry for red meat. When eaten, make sure it's lean and keep portions small (about the size of a deck of cards). Also avoid sausage, bacon and other high-fat meats.  Choose low-fat dairy. Limit higher fat dairy products such as whole or 2 percent milk, cheese and ice cream. Switch to  skim milk, fat-free yogurt and low-fat cheese.  Raise a glass to healthy eating. If it's OK with your doctor, have a glass of wine at dinner. If you don't drink alcohol, you don't need to start. Drinking purple grape juice may be an alternative to wine.      Mediterranean Diet  Why follow it? Research shows. . Those who follow the Mediterranean diet have a reduced risk of heart disease  . The diet is associated with a reduced incidence of Parkinson's and Alzheimer's diseases . People following the diet may have longer life expectancies and lower rates of chronic diseases  . The Dietary Guidelines for Americans recommends the Mediterranean diet as an eating plan to promote health and prevent disease  What Is the Mediterranean Diet?  . Healthy eating plan based on typical foods and recipes of Mediterranean-style cooking . The diet is primarily a plant based diet; these foods should make up a majority of meals   Starches - Plant based foods should make up a majority of meals - They are an important sources of vitamins, minerals, energy, antioxidants, and fiber - Choose whole grains, foods high in fiber and minimally processed items  - Typical grain sources include wheat, oats, barley, corn, brown rice, bulgar, farro, millet, polenta, couscous  - Various types of beans include chickpeas, lentils, fava beans, black beans, white beans   Fruits  Veggies - Large quantities of antioxidant rich fruits & veggies; 6 or more servings  - Vegetables can be eaten raw or lightly drizzled with oil and cooked  - Vegetables common to the traditional Mediterranean Diet include: artichokes, arugula, beets, broccoli, brussel sprouts, cabbage, carrots, celery, collard greens, cucumbers, eggplant, kale, leeks, lemons, lettuce, mushrooms, okra, onions, peas, peppers, potatoes, pumpkin, radishes, rutabaga, shallots, spinach, sweet potatoes, turnips, zucchini - Fruits common to the Mediterranean Diet include: apples,  apricots, avocados, cherries, clementines, dates, figs, grapefruits, grapes, melons, nectarines, oranges, peaches, pears, pomegranates, strawberries, tangerines  Fats - Replace butter and margarine with healthy oils, such as olive oil, canola oil, and tahini  - Limit nuts to no more than a handful a day  - Nuts include walnuts, almonds, pecans, pistachios, pine nuts  - Limit or avoid candied, honey roasted or heavily salted nuts - Olives are central to the [employer] - can be eaten whole or used in a variety of dishes   Meats Protein - Limiting red meat: no more than a few times a month - When eating red meat: choose lean cuts and keep the portion to the size of deck of cards - Eggs: approx. 0 to 4 times a week  - Fish and lean poultry: at least 2 a week  - Healthy protein sources include, chicken, [place], lean beef, lamb - Increase intake of seafood such as tuna, salmon, trout, mackerel, shrimp, scallops - Avoid or limit high fat processed meats such as sausage and bacon  Dairy -  Include moderate amounts of low fat dairy products  - Focus on healthy dairy such as fat free yogurt, skim milk, low or reduced fat cheese - Limit dairy products higher in fat such as whole or 2% milk, cheese, ice cream  Alcohol - Moderate amounts of red wine is ok  - No more than 5 oz daily for women (all ages) and men older than age 60  - No more than 10 oz of wine daily for men younger than 4065  Other - Limit sweets and other desserts  - Use herbs and spices instead of salt to flavor foods  - Herbs and spices common to the traditional Mediterranean Diet include: basil, bay leaves, chives, cloves, cumin, fennel, garlic, lavender, marjoram, mint, oregano, parsley, pepper, rosemary, sage, savory, sumac, tarragon, thyme   It's not just a diet, it's a lifestyle:  . The Mediterranean diet includes lifestyle factors typical of those in the region  . Foods, drinks and meals are best eaten with others and  savored . Daily physical activity is important for overall good health . This could be strenuous exercise like running and aerobics . This could also be more leisurely activities such as walking, housework, yard-work, or taking the stairs . Moderation is the key; a balanced and healthy diet accommodates most foods and drinks . Consider portion sizes and frequency of consumption of certain foods   Meal Ideas & Options:  . Breakfast:  o Whole wheat toast or whole wheat English muffins with peanut butter & hard boiled egg o Steel cut oats topped with apples & cinnamon and skim milk  o Fresh fruit: banana, strawberries, melon, berries, peaches  o Smoothies: strawberries, bananas, greek yogurt, peanut butter o Low fat greek yogurt with blueberries and granola  o Egg white omelet with spinach and mushrooms o Breakfast couscous: whole wheat couscous, apricots, skim milk, cranberries  . Sandwiches:  o Hummus and grilled vegetables (peppers, zucchini, squash) on whole wheat bread   o Grilled chicken on whole wheat pita with lettuce, tomatoes, cucumbers or tzatziki  o Tuna salad on whole wheat bread: tuna salad made with greek yogurt, olives, red peppers, capers, green onions o Garlic rosemary lamb pita: lamb sauted with garlic, rosemary, salt & pepper; add lettuce, cucumber, greek yogurt to pita - flavor with lemon juice and black pepper  . Seafood:  o Mediterranean grilled salmon, seasoned with garlic, basil, parsley, lemon juice and black pepper o Shrimp, lemon, and spinach whole-grain pasta salad made with low fat greek yogurt  o Seared scallops with lemon orzo  o Seared tuna steaks seasoned salt, pepper, coriander topped with tomato mixture of olives, tomatoes, olive oil, minced garlic, parsley, green onions and cappers  . Meats:  o Herbed greek chicken salad with kalamata olives, cucumber, feta  o Red bell peppers stuffed with spinach, bulgur, lean ground beef (or lentils) & topped with feta    o Kebabs: skewers of chicken, tomatoes, onions, zucchini, squash  o [place] burgers: made with red onions, mint, dill, lemon juice, feta cheese topped with roasted red peppers . Vegetarian o Cucumber salad: cucumbers, artichoke hearts, celery, red onion, feta cheese, tossed in olive oil & lemon juice  o Hummus and whole grain pita points with a greek salad (lettuce, tomato, feta, olives, cucumbers, red onion) o Lentil soup with celery, carrots made with vegetable broth, garlic, salt and pepper  o Tabouli salad: parsley, bulgur, mint, scallions, cucumbers, tomato, radishes, lemon juice, olive oil, salt and pepper.

## 2017-08-28 ENCOUNTER — Encounter: Payer: Self-pay | Admitting: Physician Assistant

## 2017-08-28 NOTE — Addendum Note (Signed)
Addended by: Tommye StandardBURGESS, Kc Summerson F on: 08/28/2017 09:26 AM   Modules accepted: Orders

## 2017-08-31 LAB — LIPID PANEL W/O CHOL/HDL RATIO
Cholesterol, Total: 93 mg/dL — ABNORMAL LOW (ref 100–199)
HDL: 30 mg/dL — ABNORMAL LOW (ref >39)
LDL CALC: 29 mg/dL (ref 0–99)
TRIGLYCERIDES: 169 mg/dL — AB (ref 0–149)
VLDL Cholesterol Cal: 34 mg/dL (ref 5–40)

## 2017-08-31 LAB — CBC
HEMATOCRIT: 37.8 % (ref 37.5–51.0)
HEMOGLOBIN: 12.2 g/dL — AB (ref 13.0–17.7)
MCH: 30.6 pg (ref 26.6–33.0)
MCHC: 32.3 g/dL (ref 31.5–35.7)
MCV: 95 fL (ref 79–97)
Platelets: 238 10*3/uL (ref 150–379)
RBC: 3.99 x10E6/uL — ABNORMAL LOW (ref 4.14–5.80)
RDW: 14 % (ref 12.3–15.4)
WBC: 5 10*3/uL (ref 3.4–10.8)

## 2017-08-31 LAB — COMPREHENSIVE METABOLIC PANEL
ALBUMIN: 4.2 g/dL (ref 3.5–5.5)
ALK PHOS: 68 IU/L (ref 39–117)
ALT: 18 IU/L (ref 0–44)
AST: 16 IU/L (ref 0–40)
Albumin/Globulin Ratio: 1.4 (ref 1.2–2.2)
BILIRUBIN TOTAL: 0.4 mg/dL (ref 0.0–1.2)
BUN / CREAT RATIO: 12 (ref 9–20)
BUN: 12 mg/dL (ref 6–24)
CHLORIDE: 103 mmol/L (ref 96–106)
CO2: 27 mmol/L (ref 20–29)
CREATININE: 0.99 mg/dL (ref 0.76–1.27)
Calcium: 9.8 mg/dL (ref 8.7–10.2)
GFR calc Af Amer: 96 mL/min/{1.73_m2} (ref >59)
GFR calc non Af Amer: 83 mL/min/{1.73_m2} (ref >59)
GLUCOSE: 109 mg/dL — AB (ref 65–99)
Globulin, Total: 2.9 g/dL (ref 1.5–4.5)
Potassium: 4.7 mmol/L (ref 3.5–5.2)
Sodium: 144 mmol/L (ref 134–144)
TOTAL PROTEIN: 7.1 g/dL (ref 6.0–8.5)

## 2017-09-02 ENCOUNTER — Ambulatory Visit: Payer: BLUE CROSS/BLUE SHIELD

## 2017-09-03 ENCOUNTER — Telehealth (HOSPITAL_COMMUNITY): Payer: Self-pay

## 2017-09-03 ENCOUNTER — Other Ambulatory Visit: Payer: Self-pay

## 2017-09-03 NOTE — Telephone Encounter (Signed)
Patients insurance is active and benefits verified through [employer] - No co-pay, no deductible, out of pocket amount of $5,000/$212.53 has been met, 10% co-insurance, and no pre-authorization is required. Passport/reference [id number]

## 2017-09-04 ENCOUNTER — Telehealth (HOSPITAL_COMMUNITY): Payer: Self-pay

## 2017-09-04 NOTE — Telephone Encounter (Signed)
Patient called back to state that waiting until march is entirely too long to begin rehab. He would like to cancel orientation and exc class. Closed referral.

## 2017-09-04 NOTE — Telephone Encounter (Signed)
Called to speak with patient in regards to Cardiac Rehab - Patient wants to do cardiac rehab here in [place] even though he lives in [place]. [place] in [employer]. Scheduled orientation on 10/17/2017 at 7:30am. Patient will attend the 6:45am exc class.

## 2017-09-07 ENCOUNTER — Other Ambulatory Visit: Payer: Self-pay | Admitting: Family Medicine

## 2017-09-17 ENCOUNTER — Telehealth: Payer: Self-pay

## 2017-09-17 NOTE — Telephone Encounter (Signed)
Mrs. Aundria RudWilkinson called concerned because her husband was complaining of chest pain after getting news about his job.  I advised her that if he is having chest pain, he should go to the emergency room.  I also explained that if he had any nitroglycerin that he could take that until EMS arrived or he got to the emergency room.  She stated that he probably would not go to the ER.  I explained that she should also call his cardiologist to let them know what is going on and for them to advise.  She acknowledged receipt.

## 2017-09-17 NOTE — Telephone Encounter (Signed)
Patient's wife, Marylene Landngela, walked into the office today with concerns of patient having chest pain. Marylene Landngela states the patient is very stubborn. The patient found out some news about his job this morning and after, started having chest pain. Marylene Landngela states she spoke with the nurse at cardiothoracic surgery this morning and they advised for the patient to take nitroglycerin and go to the nearest emergency department. Marylene Landngela forgot about the nitroglycerin and took this to the patient at work today. Marylene Landngela would like for the patient to see Dr. Dulce SellarMunley in an appointment. Kathlee Nationsdvised [name] she could stop at the front desk and schedule an appointment for the patient. Kathlee Nationsdvised [name] that if patient has to take 3 nitroglycerin, he should go to the emergency department, or if the chest pain becomes more severe. Marylene Landngela verbalized understanding. No further questions.

## 2017-09-19 ENCOUNTER — Ambulatory Visit (INDEPENDENT_AMBULATORY_CARE_PROVIDER_SITE_OTHER): Payer: BLUE CROSS/BLUE SHIELD | Admitting: Cardiology

## 2017-09-19 ENCOUNTER — Encounter: Payer: Self-pay | Admitting: Cardiology

## 2017-09-19 VITALS — BP 104/76 | HR 72 | Ht 73.0 in | Wt 219.0 lb

## 2017-09-19 DIAGNOSIS — I10 Essential (primary) hypertension: Secondary | ICD-10-CM | POA: Diagnosis not present

## 2017-09-19 DIAGNOSIS — E785 Hyperlipidemia, unspecified: Secondary | ICD-10-CM | POA: Diagnosis not present

## 2017-09-19 DIAGNOSIS — I25119 Atherosclerotic heart disease of native coronary artery with unspecified angina pectoris: Secondary | ICD-10-CM | POA: Diagnosis not present

## 2017-09-19 HISTORY — DX: Hyperlipidemia, unspecified: E78.5

## 2017-09-19 NOTE — Progress Notes (Signed)
Cardiology Office Note:    Date:  09/19/2017   ID:  Daniel Waters, DOB [birth year], MRN [id number]  PCP:  Sharlene Dory, DO  Cardiologist:  Norman Herrlich, MD    Referring MD: Sharlene Dory*    ASSESSMENT:    1. Coronary artery disease involving native coronary artery of native heart with angina pectoris (HCC)   2. Essential hypertension   3. Hyperlipidemia, unspecified hyperlipidemia type    PLAN:    In order of problems listed above:  1. Symptoms are very vague seem to be related to anxiety continue medical therapy check troponin and unless he is having typical symptoms or evidence of acute coronary syndrome at this time I would not refer for angiography.  He responded well to reassurance 2. Stable continue current treatment 3. Stable continue lipid-lowering therapy   Next appointment: 6 months   Medication Adjustments/Labs and Tests Ordered: Current medicines are reviewed at length with the patient today.  Concerns regarding medicines are outlined above.  Orders Placed This Encounter  Procedures  . Troponin I  . EKG 12-Lead   No orders of the defined types were placed in this encounter.   Chief Complaint  Patient presents with  . Follow-up    to evaluate CP     History of Present Illness:    Daniel Waters is a 62 y.o. male with a hx of CAD multivessel and bypass surgery electively 07/12/2017.  Last seen 1 month ago. Compliance with diet, lifestyle and medications: Yes He had progressed well and returned to work.  He is under stress at work his boss who is very close with was fired and when he feels under stress he gets a vague chest tightness is not exertional not relieved with rest and clearly related to work and work stressors.  Presently he exercises 30 minutes in the morning 60 minutes in the evening has no exercise intolerance exertional chest pain shortness of breath palpitation or syncope.  On physical examination he has mild  tenderness of his sternum. Past Medical History:  Diagnosis Date  . Anxiety   . Arthritis   . Benign essential HTN 05/20/2017  . Chest pain 05/20/2017  . Conductive hearing loss of both ears 05/01/2017  . Coronary artery disease   . History of gout 12/03/2016  . History of kidney stones   . Hypertriglyceridemia 04/17/2017  . Sleep apnea 05/20/2017   does not have per pt  . Vitamin D deficiency 05/20/2017    Past Surgical History:  Procedure Laterality Date  . CARPAL TUNNEL RELEASE    . COLONOSCOPY    . CORONARY ARTERY BYPASS GRAFT N/A 07/12/2017   Procedure: CORONARY ARTERY BYPASS GRAFTING (CABG)X3, USING LEFT INTERNAL MAMMARY ARTERY AND RIGHT GREATER SAPHENOUS VEIN ENDOSCOPIC VESSEL HARVESTING, TEE;  Surgeon: [name], MD;  Location: [facility] OR;  Service: Open Heart Surgery;  Laterality: N/A;  . INTRAVASCULAR PRESSURE WIRE/FFR STUDY N/A 06/24/2017   Procedure: INTRAVASCULAR PRESSURE WIRE/FFR STUDY;  Surgeon: Swaziland, Peter M, MD;  [facility]: [facility] INVASIVE CV LAB;  Service: Cardiovascular;  Laterality: N/A;  . knee Bilateral   . LEFT HEART CATH AND CORONARY ANGIOGRAPHY N/A 06/24/2017   Procedure: LEFT HEART CATH AND CORONARY ANGIOGRAPHY;  Surgeon: Swaziland, Peter M, MD;  Location: [facility] INVASIVE CV LAB;  Service: Cardiovascular;  Laterality: N/A;  . TEE WITHOUT CARDIOVERSION N/A 07/12/2017   Procedure: TRANSESOPHAGEAL ECHOCARDIOGRAM (TEE);  Surgeon: [name], MD;  Location: [facility] OR;  Service: Open Heart Surgery;  Laterality: N/A;  Current Medications: Current Meds  Medication Sig  . allopurinol (ZYLOPRIM) 100 MG tablet TAKE 1 TABLET BY MOUTH EVERY DAY  . aspirin EC 325 MG EC tablet Take 1 tablet (325 mg total) by mouth daily.  Marland Kitchen. atorvastatin (LIPITOR) 40 MG tablet Take 40 mg by mouth daily.   . metoprolol tartrate (LOPRESSOR) 25 MG tablet Take 1 tablet (25 mg total) by mouth 2 (two) times daily.     Allergies:   Sulfur   Social History   Socioeconomic History  . Marital  status: Married    Spouse name: None  . Number of children: None  . Years of education: None  . Highest education level: None  Social Needs  . Financial resource strain: None  . Food insecurity - worry: None  . Food insecurity - inability: None  . Transportation needs - medical: None  . Transportation needs - non-medical: None  Occupational History  . None  Tobacco Use  . Smoking status: Former Smoker    Packs/day: 1.00    Years: 4.00    Pack years: 4.00    Types: Cigarettes  . Smokeless tobacco: Never Used  . Tobacco comment: SMOKED ONLY IN COLLEGE  Substance and Sexual Activity  . Alcohol use: Yes    Comment: has not had any in 2 months beer and liquor at times  . Drug use: No  . Sexual activity: None  Other Topics Concern  . None  Social History Narrative  . None     Family History: The patient's family history includes Cancer in his paternal grandmother; Heart attack (age of onset: 5973) in his father; Stroke in his paternal grandfather. ROS:   Please see the history of present illness.    All other systems reviewed and are negative.  EKGs/Labs/Other Studies Reviewed:    The following studies were reviewed today:  EKG:  EKG ordered today.  The ekg ordered today demonstrates sinus rhythm and is normal  Recent Labs: 07/13/2017: Magnesium 1.8 08/30/2017: ALT 18; BUN 12; Creatinine, Ser 0.99; Hemoglobin 12.2; Platelets 238; Potassium 4.7; Sodium 144  Recent Lipid Panel    Component Value Date/Time   CHOL 93 (L) 08/30/2017 0820   TRIG 169 (H) 08/30/2017 0820   HDL 30 (L) 08/30/2017 0820   CHOLHDL 4 04/17/2017 0818   LDLCALC 29 08/30/2017 0820   LDLDIRECT 52.0 04/17/2017 0818    Physical Exam:    VS:  BP 104/76 (BP Location: Left Arm, Patient Position: Sitting, Cuff Size: Large)   Pulse 72   Ht 6\' 1"  (1.854 m)   Wt 219 lb (99.3 kg)   SpO2 98%   BMI 28.89 kg/m     Wt Readings from Last 3 Encounters:  09/19/17 219 lb (99.3 kg)  08/27/17 220 lb 12.8 oz  (100.2 kg)  08/27/17 219 lb (99.3 kg)     GEN: Anxious Well nourished, well developed in no acute distress HEENT: Normal NECK: No JVD; No carotid bruits LYMPHATICS: No lymphadenopathy CARDIAC: mild sternal tenderness RRR, no murmurs, rubs, gallops RESPIRATORY:  Clear to auscultation without rales, wheezing or rhonchi  ABDOMEN: Soft, non-tender, non-distended MUSCULOSKELETAL:  No edema; No deformity  SKIN: Warm and dry NEUROLOGIC:  Alert and oriented x 3 PSYCHIATRIC:  Normal affect    Signed, Norman HerrlichBrian Munley, MD  09/19/2017 12:52 PM    [facility]

## 2017-09-19 NOTE — Patient Instructions (Addendum)
Medication Instructions:  Your physician has recommended you make the following change in your medication:  Take Aleve daily as needed  Labwork: Your physician recommends that you return for lab work in: today. Troponin  Testing/Procedures: You had an EKG today.  Follow-Up: Your physician wants you to follow-up in: 6 months. You will receive a reminder letter in the mail two months in advance. If you don't receive a letter, please call our office to schedule the follow-up appointment.  Any Other Special Instructions Will Be Listed Below (If Applicable).     If you need a refill on your cardiac medications before your next appointment, please call your pharmacy.  Take Aleve daily as needed

## 2017-09-20 ENCOUNTER — Encounter (INDEPENDENT_AMBULATORY_CARE_PROVIDER_SITE_OTHER): Payer: Self-pay

## 2017-09-20 LAB — TROPONIN I

## 2017-09-23 ENCOUNTER — Encounter: Payer: Self-pay | Admitting: Family Medicine

## 2017-09-23 MED ORDER — ATORVASTATIN CALCIUM 40 MG PO TABS: 40.0000 mg | ORAL_TABLET | Freq: Every day | ORAL | 3 refills | Status: DC

## 2017-09-23 MED ORDER — ALLOPURINOL 100 MG PO TABS: 100.0000 mg | ORAL_TABLET | Freq: Every day | ORAL | 0 refills | Status: DC

## 2017-09-23 MED ORDER — METOPROLOL TARTRATE 25 MG PO TABS: 25.0000 mg | ORAL_TABLET | Freq: Two times a day (BID) | ORAL | 3 refills | Status: DC

## 2017-10-01 ENCOUNTER — Ambulatory Visit (INDEPENDENT_AMBULATORY_CARE_PROVIDER_SITE_OTHER): Payer: BLUE CROSS/BLUE SHIELD | Admitting: Internal Medicine

## 2017-10-01 ENCOUNTER — Encounter: Payer: Self-pay | Admitting: Internal Medicine

## 2017-10-01 VITALS — BP 126/70 | HR 70 | Temp 98.0°F | Resp 14 | Ht 73.0 in | Wt 219.2 lb

## 2017-10-01 DIAGNOSIS — R21 Rash and other nonspecific skin eruption: Secondary | ICD-10-CM | POA: Diagnosis not present

## 2017-10-01 MED ORDER — TRIAMCINOLONE ACETONIDE 0.1 % EX LOTN
1.0000 | TOPICAL_LOTION | Freq: Three times a day (TID) | CUTANEOUS | 0 refills | Status: DC
Start: 2017-10-01 — End: 2018-06-30

## 2017-10-01 NOTE — Progress Notes (Signed)
Subjective:    Patient ID: Daniel Waters, male    DOB: 14-Mar-1956, 62 y.o.   MRN: [id number]  DOS:  10/01/2017 Type of visit - description : acute Interval history: Rash started 2 days ago, at the lower extremities. Very mildly itchy, overall is looking better without any intervention. He thinks he had something similar last year.   Review of Systems  No fever chills No joint aches. No tick bites No new medicines Past Medical History:  Diagnosis Date  . Anxiety   . Arthritis   . Benign essential HTN 05/20/2017  . Chest pain 05/20/2017  . Conductive hearing loss of both ears 05/01/2017  . Coronary artery disease   . History of gout 12/03/2016  . History of kidney stones   . Hypertriglyceridemia 04/17/2017  . Sleep apnea 05/20/2017   does not have per pt  . Vitamin D deficiency 05/20/2017    Past Surgical History:  Procedure Laterality Date  . CARPAL TUNNEL RELEASE    . COLONOSCOPY    . CORONARY ARTERY BYPASS GRAFT N/A 07/12/2017   Procedure: CORONARY ARTERY BYPASS GRAFTING (CABG)X3, USING LEFT INTERNAL MAMMARY ARTERY AND RIGHT GREATER SAPHENOUS VEIN ENDOSCOPIC VESSEL HARVESTING, TEE;  Surgeon: Alleen BorneBartle, [name], MD;  Location: [facility] OR;  Service: Open Heart Surgery;  Laterality: N/A;  . INTRAVASCULAR PRESSURE WIRE/FFR STUDY N/A 06/24/2017   Procedure: INTRAVASCULAR PRESSURE WIRE/FFR STUDY;  Surgeon: SwazilandJordan, Peter M, MD;  [facility];  Service: Cardiovascular;  Laterality: N/A;  . knee Bilateral   . LEFT HEART CATH AND CORONARY ANGIOGRAPHY N/A 06/24/2017   Procedure: LEFT HEART CATH AND CORONARY ANGIOGRAPHY;  Surgeon: SwazilandJordan, Peter M, MD;  [facility];  Service: Cardiovascular;  Laterality: N/A;  . TEE WITHOUT CARDIOVERSION N/A 07/12/2017   Procedure: TRANSESOPHAGEAL ECHOCARDIOGRAM (TEE);  Surgeon: Alleen BorneBartle, [name], MD;  [facility];  Service: Open Heart Surgery;  Laterality: N/A;    Social History   Socioeconomic History  . Marital  status: Married    Spouse name: Not on file  . Number of children: Not on file  . Years of education: Not on file  . Highest education level: Not on file  Social Needs  . Financial resource strain: Not on file  . Food insecurity - worry: Not on file  . Food insecurity - inability: Not on file  . Transportation needs - medical: Not on file  . Transportation needs - non-medical: Not on file  Occupational History  . Not on file  Tobacco Use  . Smoking status: Former Smoker    Packs/day: 1.00    Years: 4.00    Pack years: 4.00    Types: Cigarettes  . Smokeless tobacco: Never Used  . Tobacco comment: SMOKED ONLY IN COLLEGE  Substance and Sexual Activity  . Alcohol use: Yes    Comment: has not had any in 2 months beer and liquor at times  . Drug use: No  . Sexual activity: Not on file  Other Topics Concern  . Not on file  Social History Narrative  . Not on file      Allergies as of 10/01/2017      Reactions   Sulfur Nausea And Vomiting, Rash   INTOLERANCE >  NAUSEA & VOMITING      Medication List        Accurate as of 10/01/17  4:17 PM. Always use your most recent med list.  allopurinol 100 MG tablet Commonly known as:  ZYLOPRIM Take 1 tablet (100 mg total) by mouth daily.   aspirin 325 MG EC tablet Take 1 tablet (325 mg total) by mouth daily.   atorvastatin 40 MG tablet Commonly known as:  LIPITOR Take 1 tablet (40 mg total) by mouth daily.   metoprolol tartrate 25 MG tablet Commonly known as:  LOPRESSOR Take 1 tablet (25 mg total) by mouth 2 (two) times daily.          Objective:   Physical Exam BP 126/70 (BP Location: Left Arm, Patient Position: Sitting, Cuff Size: Small)   Pulse 70   Temp 98 F (36.7 C) (Oral)   Resp 14   Ht 6\' 1"  (1.854 m)   Wt 219 lb 4 oz (99.5 kg)   SpO2 98%   BMI 28.93 kg/m  General:   Well developed, well nourished . NAD.  HEENT:  Normocephalic . Face symmetric, atraumatic Skin:  Symmetric, lower extremity  erythematous rash, slightly papular.  Located at the anterior medial aspect of the legs.  See picture. Neurologic:  alert & oriented X3.  Speech normal, gait appropriate for age and unassisted Psych--  Cognition and judgment appear intact.  Cooperative with normal attention span and concentration.  Behavior appropriate. No anxious or depressed appearing.             Assessment & Plan:   62 year old gentleman with history of HTN, CAD, hyperglycemia, high cholesterol, presents with: Rash: As described above, etiology not completely clear, could be eczema versus others, no systemic symptoms. To be sure, will check platelet count with a CBC, prescribed Kenalog lotion, Claritin, call if not better.

## 2017-10-01 NOTE — Progress Notes (Signed)
Pre visit review using our clinic review tool, if applicable. No additional management support is needed unless otherwise documented below in the visit note. 

## 2017-10-01 NOTE — Patient Instructions (Addendum)
Get your blood work  Use the Kenalog lotion twice a day  Call if fever, chills,   rash spreads or is not improving the next few days  Okay to take OTC Claritin if you have mild itching.

## 2017-10-02 LAB — CBC WITH DIFFERENTIAL/PLATELET
BASOS ABS: 0 10*3/uL (ref 0.0–0.1)
BASOS PCT: 0.7 % (ref 0.0–3.0)
EOS ABS: 0.1 10*3/uL (ref 0.0–0.7)
Eosinophils Relative: 2.4 % (ref 0.0–5.0)
HEMATOCRIT: 41 % (ref 39.0–52.0)
Hemoglobin: 13.6 g/dL (ref 13.0–17.0)
LYMPHS PCT: 35.6 % (ref 12.0–46.0)
Lymphs Abs: 2 10*3/uL (ref 0.7–4.0)
MCHC: 33.1 g/dL (ref 30.0–36.0)
MCV: 96.2 fl (ref 78.0–100.0)
MONO ABS: 0.5 10*3/uL (ref 0.1–1.0)
Monocytes Relative: 8.9 % (ref 3.0–12.0)
NEUTROS ABS: 3 10*3/uL (ref 1.4–7.7)
NEUTROS PCT: 52.4 % (ref 43.0–77.0)
PLATELETS: 238 10*3/uL (ref 150.0–400.0)
RBC: 4.26 Mil/uL (ref 4.22–5.81)
RDW: 15 % (ref 11.5–15.5)
WBC: 5.6 10*3/uL (ref 4.0–10.5)

## 2017-10-16 ENCOUNTER — Encounter: Payer: BLUE CROSS/BLUE SHIELD | Admitting: Family Medicine

## 2017-10-17 ENCOUNTER — Ambulatory Visit (HOSPITAL_COMMUNITY): Payer: BLUE CROSS/BLUE SHIELD

## 2017-10-21 ENCOUNTER — Ambulatory Visit (HOSPITAL_COMMUNITY): Payer: BLUE CROSS/BLUE SHIELD

## 2017-10-25 ENCOUNTER — Ambulatory Visit (HOSPITAL_COMMUNITY): Payer: BLUE CROSS/BLUE SHIELD

## 2017-10-28 ENCOUNTER — Ambulatory Visit (HOSPITAL_COMMUNITY): Payer: BLUE CROSS/BLUE SHIELD

## 2017-11-01 ENCOUNTER — Ambulatory Visit (HOSPITAL_COMMUNITY): Payer: BLUE CROSS/BLUE SHIELD

## 2017-11-04 ENCOUNTER — Ambulatory Visit (HOSPITAL_COMMUNITY): Payer: BLUE CROSS/BLUE SHIELD

## 2017-11-08 ENCOUNTER — Ambulatory Visit (HOSPITAL_COMMUNITY): Payer: BLUE CROSS/BLUE SHIELD

## 2017-11-11 ENCOUNTER — Ambulatory Visit (HOSPITAL_COMMUNITY): Payer: BLUE CROSS/BLUE SHIELD

## 2017-11-15 ENCOUNTER — Ambulatory Visit (HOSPITAL_COMMUNITY): Payer: BLUE CROSS/BLUE SHIELD

## 2017-11-18 ENCOUNTER — Ambulatory Visit (HOSPITAL_COMMUNITY): Payer: BLUE CROSS/BLUE SHIELD

## 2017-11-22 ENCOUNTER — Ambulatory Visit (HOSPITAL_COMMUNITY): Payer: BLUE CROSS/BLUE SHIELD

## 2017-11-25 ENCOUNTER — Ambulatory Visit (HOSPITAL_COMMUNITY): Payer: BLUE CROSS/BLUE SHIELD

## 2017-11-29 ENCOUNTER — Ambulatory Visit (HOSPITAL_COMMUNITY): Payer: BLUE CROSS/BLUE SHIELD

## 2017-12-02 ENCOUNTER — Ambulatory Visit (HOSPITAL_COMMUNITY): Payer: BLUE CROSS/BLUE SHIELD

## 2017-12-06 ENCOUNTER — Ambulatory Visit (HOSPITAL_COMMUNITY): Payer: BLUE CROSS/BLUE SHIELD

## 2017-12-09 ENCOUNTER — Ambulatory Visit (HOSPITAL_COMMUNITY): Payer: BLUE CROSS/BLUE SHIELD

## 2017-12-13 ENCOUNTER — Ambulatory Visit (HOSPITAL_COMMUNITY): Payer: BLUE CROSS/BLUE SHIELD

## 2017-12-16 ENCOUNTER — Ambulatory Visit (HOSPITAL_COMMUNITY): Payer: BLUE CROSS/BLUE SHIELD

## 2017-12-20 ENCOUNTER — Ambulatory Visit (HOSPITAL_COMMUNITY): Payer: BLUE CROSS/BLUE SHIELD

## 2017-12-23 ENCOUNTER — Ambulatory Visit (HOSPITAL_COMMUNITY): Payer: BLUE CROSS/BLUE SHIELD

## 2017-12-27 ENCOUNTER — Ambulatory Visit (HOSPITAL_COMMUNITY): Payer: BLUE CROSS/BLUE SHIELD

## 2018-01-03 ENCOUNTER — Ambulatory Visit (HOSPITAL_COMMUNITY): Payer: BLUE CROSS/BLUE SHIELD

## 2018-01-04 ENCOUNTER — Other Ambulatory Visit: Payer: Self-pay | Admitting: Family Medicine

## 2018-01-06 ENCOUNTER — Ambulatory Visit (HOSPITAL_COMMUNITY): Payer: BLUE CROSS/BLUE SHIELD

## 2018-01-10 ENCOUNTER — Ambulatory Visit (HOSPITAL_COMMUNITY): Payer: BLUE CROSS/BLUE SHIELD

## 2018-01-13 ENCOUNTER — Ambulatory Visit (HOSPITAL_COMMUNITY): Payer: BLUE CROSS/BLUE SHIELD

## 2018-01-17 ENCOUNTER — Ambulatory Visit (HOSPITAL_COMMUNITY): Payer: BLUE CROSS/BLUE SHIELD

## 2018-06-06 ENCOUNTER — Other Ambulatory Visit: Payer: Self-pay | Admitting: Family Medicine

## 2018-06-12 ENCOUNTER — Other Ambulatory Visit: Payer: Self-pay | Admitting: Family Medicine

## 2018-06-27 NOTE — Progress Notes (Signed)
Cardiology Office Note:    Date:  06/30/2018   ID:  Daniel Waters, DOB 1955-09-27, MRN [id number]  PCP:  Patient, No Pcp Per  Cardiologist:  Shirlee More, MD    Referring MD: Shelda Pal*    ASSESSMENT:    1. Coronary artery disease involving native coronary artery of native heart with angina pectoris ([place])   2. Essential hypertension   3. Hyperlipidemia, unspecified hyperlipidemia type    PLAN:    In order of problems listed above:  1. CAD is stable after bypass surgery having no angina on current treatment at this time will continue lifestyle and current medications and I do not feel he requires an ischemia 2. Stable at target continue current treatment 3. Continue statin check lipid profile and liver function today for efficacy and safety   Next appointment: One year   Medication Adjustments/Labs and Tests Ordered: Current medicines are reviewed at length with the patient today.  Concerns regarding medicines are outlined above.  Orders Placed This Encounter  Procedures  . Lipid Profile  . Comp Met (CMET)  . EKG 12-Lead   No orders of the defined types were placed in this encounter.   No chief complaint on file.   History of Present Illness:    Daniel Waters is a 62 y.o. male with a hx of CAD multivessel and bypass surgery electively 07/12/2017  last seen 02/141/9. 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 Anxiety   . Arthritis   . Benign essential HTN 05/20/2017  . Chest pain 05/20/2017  . Conductive hearing loss of both ears 05/01/2017  . Coronary artery disease   . History of gout 12/03/2016  . History of kidney stones   . Hypertriglyceridemia 04/17/2017  . Sleep apnea 05/20/2017   does not have per pt  .  Vitamin D deficiency 05/20/2017    Past Surgical History:  Procedure Laterality Date  . CARPAL TUNNEL RELEASE    . COLONOSCOPY    . CORONARY ARTERY BYPASS GRAFT N/A 07/12/2017   Procedure: CORONARY ARTERY BYPASS GRAFTING (CABG)X3, USING LEFT INTERNAL MAMMARY ARTERY AND RIGHT GREATER SAPHENOUS VEIN ENDOSCOPIC VESSEL HARVESTING, TEE;  Surgeon: Gaye Pollack, MD;  Location: [place];  Service: Open Heart Surgery;  Laterality: N/A;  . INTRAVASCULAR PRESSURE WIRE/FFR STUDY N/A 06/24/2017   Procedure: INTRAVASCULAR PRESSURE WIRE/FFR STUDY;  Surgeon: Martinique, Peter M, MD;  Location: [place] CV LAB;  Service: Cardiovascular;  Laterality: N/A;  . knee Bilateral   . LEFT HEART CATH AND CORONARY ANGIOGRAPHY N/A 06/24/2017   Procedure: LEFT HEART CATH AND CORONARY ANGIOGRAPHY;  Surgeon: Martinique, Peter M, MD;  Location: [place] CV LAB;  Service: Cardiovascular;  Laterality: N/A;  . TEE WITHOUT CARDIOVERSION N/A 07/12/2017   Procedure: TRANSESOPHAGEAL ECHOCARDIOGRAM (TEE);  Surgeon: Gaye Pollack, MD;  Location: [place];  Service: Open Heart Surgery;  Laterality: N/A;    Current Medications: Current Meds  Medication Sig  . allopurinol (ZYLOPRIM) 100 MG tablet TAKE 1 TABLET BY MOUTH EVERY DAY  . aspirin EC 325 MG EC tablet Take 1 tablet (325 mg total) by mouth daily.  Marland Kitchen atorvastatin (LIPITOR) 40 MG tablet Take 1 tablet (40 mg total) by mouth daily.  . metoprolol tartrate (LOPRESSOR) 25 MG tablet Take  1 tablet (25 mg total) by mouth 2 (two) times daily.     Allergies:   Sulfur   Social History   Socioeconomic History  . Marital status: Married    Spouse name: Not on file  . Number of children: Not on file  . Years of education: Not on file  . Highest education level: Not on file  Occupational History  . Not on file  Social Needs  . Financial resource strain: Not on file  . Food insecurity:    Worry: Not on file    Inability: Not on file  . Transportation needs:    Medical: Not on file     Non-medical: Not on file  Tobacco Use  . Smoking status: Former Smoker    Packs/day: 1.00    Years: 4.00    Pack years: 4.00    Types: Cigarettes  . Smokeless tobacco: Never Used  . Tobacco comment: SMOKED ONLY IN COLLEGE  Substance and Sexual Activity  . Alcohol use: Yes    Comment: has not had any in 2 months beer and liquor at times  . Drug use: No  . Sexual activity: Not on file  Lifestyle  . Physical activity:    Days per week: Not on file    Minutes per session: Not on file  . Stress: Not on file  Relationships  . Social connections:    Talks on phone: Not on file    Gets together: Not on file    Attends religious service: Not on file    Active member of club or organization: Not on file    Attends meetings of clubs or organizations: Not on file    Relationship status: Not on file  Other Topics Concern  . Not on file  Social History Narrative  . Not on file     Family History: The patient's family history includes Cancer in his paternal grandmother; Heart attack (age of onset: 30) in his father; Stroke in his paternal grandfather. ROS:   Please see the history of present illness.    All other systems reviewed and are negative.  EKGs/Labs/Other Studies Reviewed:    The following studies were reviewed today:  EKG:  EKG ordered today.  The ekg ordered today demonstrates sinus rhythm nonspecific T wave otherwise normal EKG  Recent Labs: 07/13/2017: Magnesium 1.8 08/30/2017: ALT 18; BUN 12; Creatinine, Ser 0.99; Potassium 4.7; Sodium 144 10/01/2017: Hemoglobin 13.6; Platelets 238.0  Recent Lipid Panel    Component Value Date/Time   CHOL 93 (L) 08/30/2017 0820   TRIG 169 (H) 08/30/2017 0820   HDL 30 (L) 08/30/2017 0820   CHOLHDL 4 04/17/2017 0818   LDLCALC 29 08/30/2017 0820   LDLDIRECT 52.0 04/17/2017 0818    Physical Exam:    VS:  BP 110/78 (BP Location: Right Arm, Patient Position: Sitting, Cuff Size: Large)   Pulse 64   Ht _0  (1.854 m)   Wt 225  lb (102.1 kg)   SpO2 98%   BMI 29.69 kg/m     Wt Readings from Last 3 Encounters:  06/30/18 225 lb (102.1 kg)  10/01/17 219 lb 4 oz (99.5 kg)  09/19/17 219 lb (99.3 kg)     GEN:  Well nourished, well developed in no acute distress HEENT: Normal NECK: No JVD; No carotid bruits LYMPHATICS: No lymphadenopathy CARDIAC: RRR, no murmurs, rubs, gallops RESPIRATORY:  Clear to auscultation without rales, wheezing or rhonchi  ABDOMEN: Soft, non-tender, non-distended MUSCULOSKELETAL:  No edema; No  deformity  SKIN: Warm and dry NEUROLOGIC:  Alert and oriented x 3 PSYCHIATRIC:  Normal affect    Signed, Shirlee More, MD  06/30/2018 9:22 AM     [facility]

## 2018-06-30 ENCOUNTER — Encounter: Payer: Self-pay | Admitting: Cardiology

## 2018-06-30 ENCOUNTER — Ambulatory Visit (INDEPENDENT_AMBULATORY_CARE_PROVIDER_SITE_OTHER): Payer: 59 | Admitting: Cardiology

## 2018-06-30 VITALS — BP 110/78 | HR 64 | Ht 73.0 in | Wt 225.0 lb

## 2018-06-30 DIAGNOSIS — I10 Essential (primary) hypertension: Secondary | ICD-10-CM | POA: Diagnosis not present

## 2018-06-30 DIAGNOSIS — E785 Hyperlipidemia, unspecified: Secondary | ICD-10-CM

## 2018-06-30 DIAGNOSIS — I25119 Atherosclerotic heart disease of native coronary artery with unspecified angina pectoris: Secondary | ICD-10-CM | POA: Diagnosis not present

## 2018-06-30 LAB — LIPID PANEL
CHOL/HDL RATIO: 3.7 ratio (ref 0.0–5.0)
Cholesterol, Total: 128 mg/dL (ref 100–199)
HDL: 35 mg/dL — AB (ref >39)
LDL Calculated: 55 mg/dL (ref 0–99)
Triglycerides: 189 mg/dL — ABNORMAL HIGH (ref 0–149)
VLDL CHOLESTEROL CAL: 38 mg/dL (ref 5–40)

## 2018-06-30 LAB — COMPREHENSIVE METABOLIC PANEL
ALK PHOS: 60 IU/L (ref 39–117)
ALT: 19 IU/L (ref 0–44)
AST: 18 IU/L (ref 0–40)
Albumin/Globulin Ratio: 2 (ref 1.2–2.2)
Albumin: 4.5 g/dL (ref 3.6–4.8)
BUN/Creatinine Ratio: 13 (ref 10–24)
BUN: 13 mg/dL (ref 8–27)
Bilirubin Total: 0.6 mg/dL (ref 0.0–1.2)
CALCIUM: 9.8 mg/dL (ref 8.6–10.2)
CO2: 24 mmol/L (ref 20–29)
Chloride: 102 mmol/L (ref 96–106)
Creatinine, Ser: 1.03 mg/dL (ref 0.76–1.27)
GFR calc Af Amer: 91 mL/min/{1.73_m2} (ref >59)
GFR, EST NON AFRICAN AMERICAN: 79 mL/min/{1.73_m2} (ref >59)
GLOBULIN, TOTAL: 2.2 g/dL (ref 1.5–4.5)
Glucose: 97 mg/dL (ref 65–99)
Potassium: 5.2 mmol/L (ref 3.5–5.2)
SODIUM: 141 mmol/L (ref 134–144)
Total Protein: 6.7 g/dL (ref 6.0–8.5)

## 2018-06-30 NOTE — Patient Instructions (Signed)
Medication Instructions:  Your physician recommends that you continue on your current medications as directed. Please refer to the Current Medication list given to you today.  If you need a refill on your cardiac medications before your next appointment, please call your pharmacy.   Lab work: Your physician recommends that you return for lab work today: lipid panel, CMP.   If you have labs (blood work) drawn today and your tests are completely normal, you will receive your results only by: . MyChart Message (if you have MyChart) OR . A paper copy in the mail If you have any lab test that is abnormal or we need to change your treatment, we will call you to review the results.  Testing/Procedures: You had an EKG today.   Follow-Up: At [facility], you and your health needs are our priority.  As part of our continuing mission to provide you with exceptional heart care, we have created designated Provider Care Teams.  These Care Teams include your primary Cardiologist (physician) and Advanced Practice Providers (APPs -  Physician Assistants and Nurse Practitioners) who all work together to provide you with the care you need, when you need it. You will need a follow up appointment in 1 years.  Please call our office 2 months in advance to schedule this appointment.    

## 2018-07-09 ENCOUNTER — Other Ambulatory Visit: Payer: Self-pay | Admitting: Family Medicine

## 2018-09-22 ENCOUNTER — Other Ambulatory Visit: Payer: Self-pay | Admitting: Cardiology

## 2018-09-22 MED ORDER — ATORVASTATIN CALCIUM 40 MG PO TABS: 40.0000 mg | ORAL_TABLET | Freq: Every day | ORAL | 1 refills | Status: DC

## 2018-09-22 MED ORDER — METOPROLOL TARTRATE 25 MG PO TABS: 25.0000 mg | ORAL_TABLET | Freq: Two times a day (BID) | ORAL | 1 refills | Status: DC

## 2018-09-22 NOTE — Telephone Encounter (Signed)
Lipitor 40 mg daily and Metoprolol tartrate 25 mg twice daily refilled

## 2018-09-29 ENCOUNTER — Telehealth: Payer: Self-pay | Admitting: Cardiology

## 2018-09-29 NOTE — Telephone Encounter (Signed)
Advised patient's wife, Marylene Land, per DPR that since Dr. Carmelia Roller prescribed this medication and it is not a cardiac medication that she would have to contact Dr. Hollie Beach office for refills. Marylene Land explained that the patient has not seen Dr. Carmelia Roller in a long time and was hoping that Dr. Dulce Sellar would refill it. They have recently moved to [place] and have not yet established with a new PCP. Informed her that I would ask Dr. Dulce Sellar if he is okay with refilling this medication and let her know. Will have Dr. Dulce Sellar advise.

## 2018-09-29 NOTE — Telephone Encounter (Signed)
° ° ° °  1. Which medications need to be refilled? (please list name of each medication and dose if known) Allopurinol 100mg   2. Which pharmacy/location (including street and city if local pharmacy) is medication to be sent to? [facility] in [place]  3. Do they need a 30 day or 90 day supply? 90

## 2018-09-29 NOTE — Telephone Encounter (Signed)
Ok 1 month and 1 refill

## 2018-09-30 MED ORDER — ALLOPURINOL 100 MG PO TABS: 100.0000 mg | ORAL_TABLET | Freq: Every day | ORAL | 0 refills | Status: DC

## 2018-09-30 MED ORDER — ALLOPURINOL 100 MG PO TABS: 100.0000 mg | ORAL_TABLET | Freq: Every day | ORAL | 1 refills | Status: DC

## 2018-09-30 NOTE — Telephone Encounter (Signed)
30 day supply of allopurinol sent to the [facility] in [place], [place] as requested with one refill only per Dr. Dulce Sellar. Informed Marylene Land that further refills will have to come from new PCP. Marylene Land verbalized understanding. No further questions.

## 2019-01-19 ENCOUNTER — Other Ambulatory Visit: Payer: Self-pay | Admitting: Cardiology

## 2019-02-11 IMAGING — DX DG CHEST 2V
2 series · 2 of 2 positions shown · non-contrast
Comparison: No prior.

CLINICAL DATA: Abnormal exercise myocardial perfusion study.
Preprocedural evaluation.

EXAM:
CHEST  2 VIEW

[chest pa]
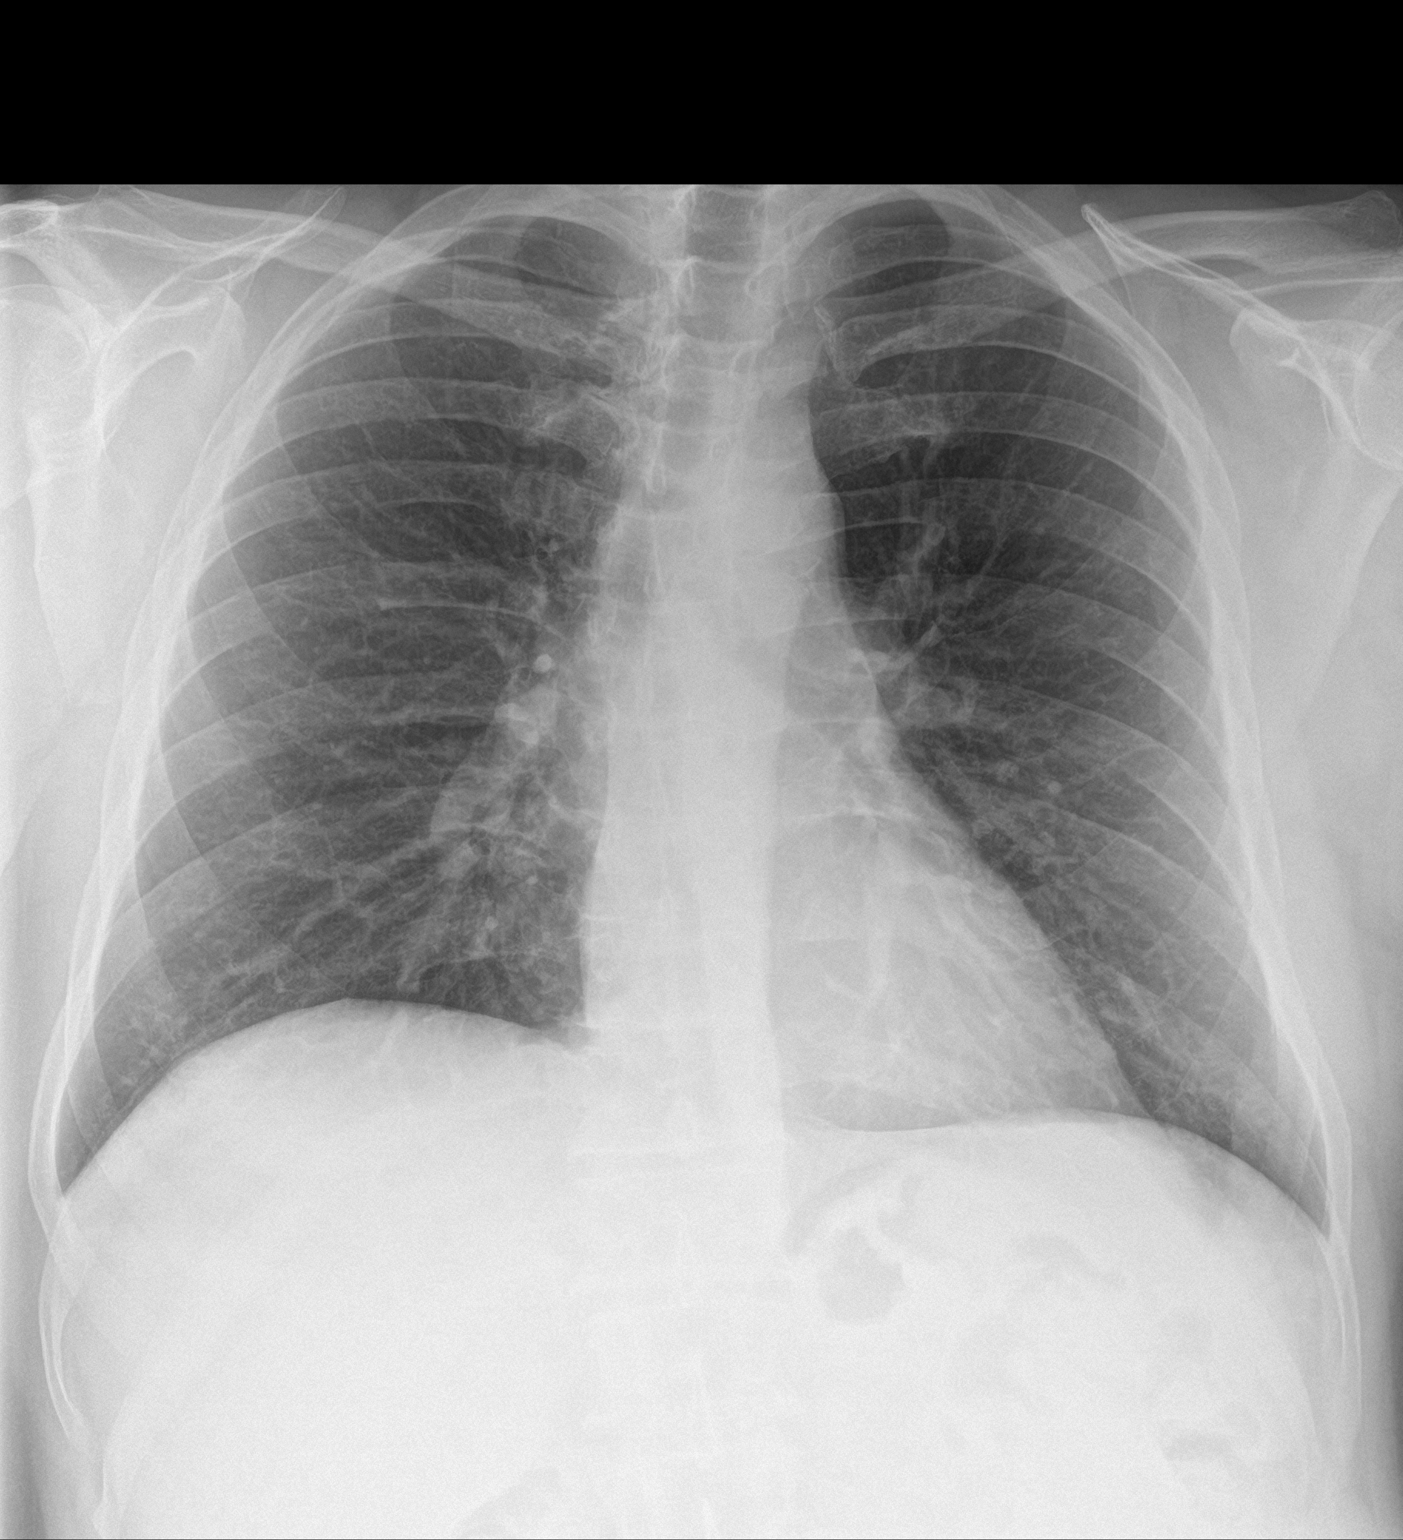

[chest lat]
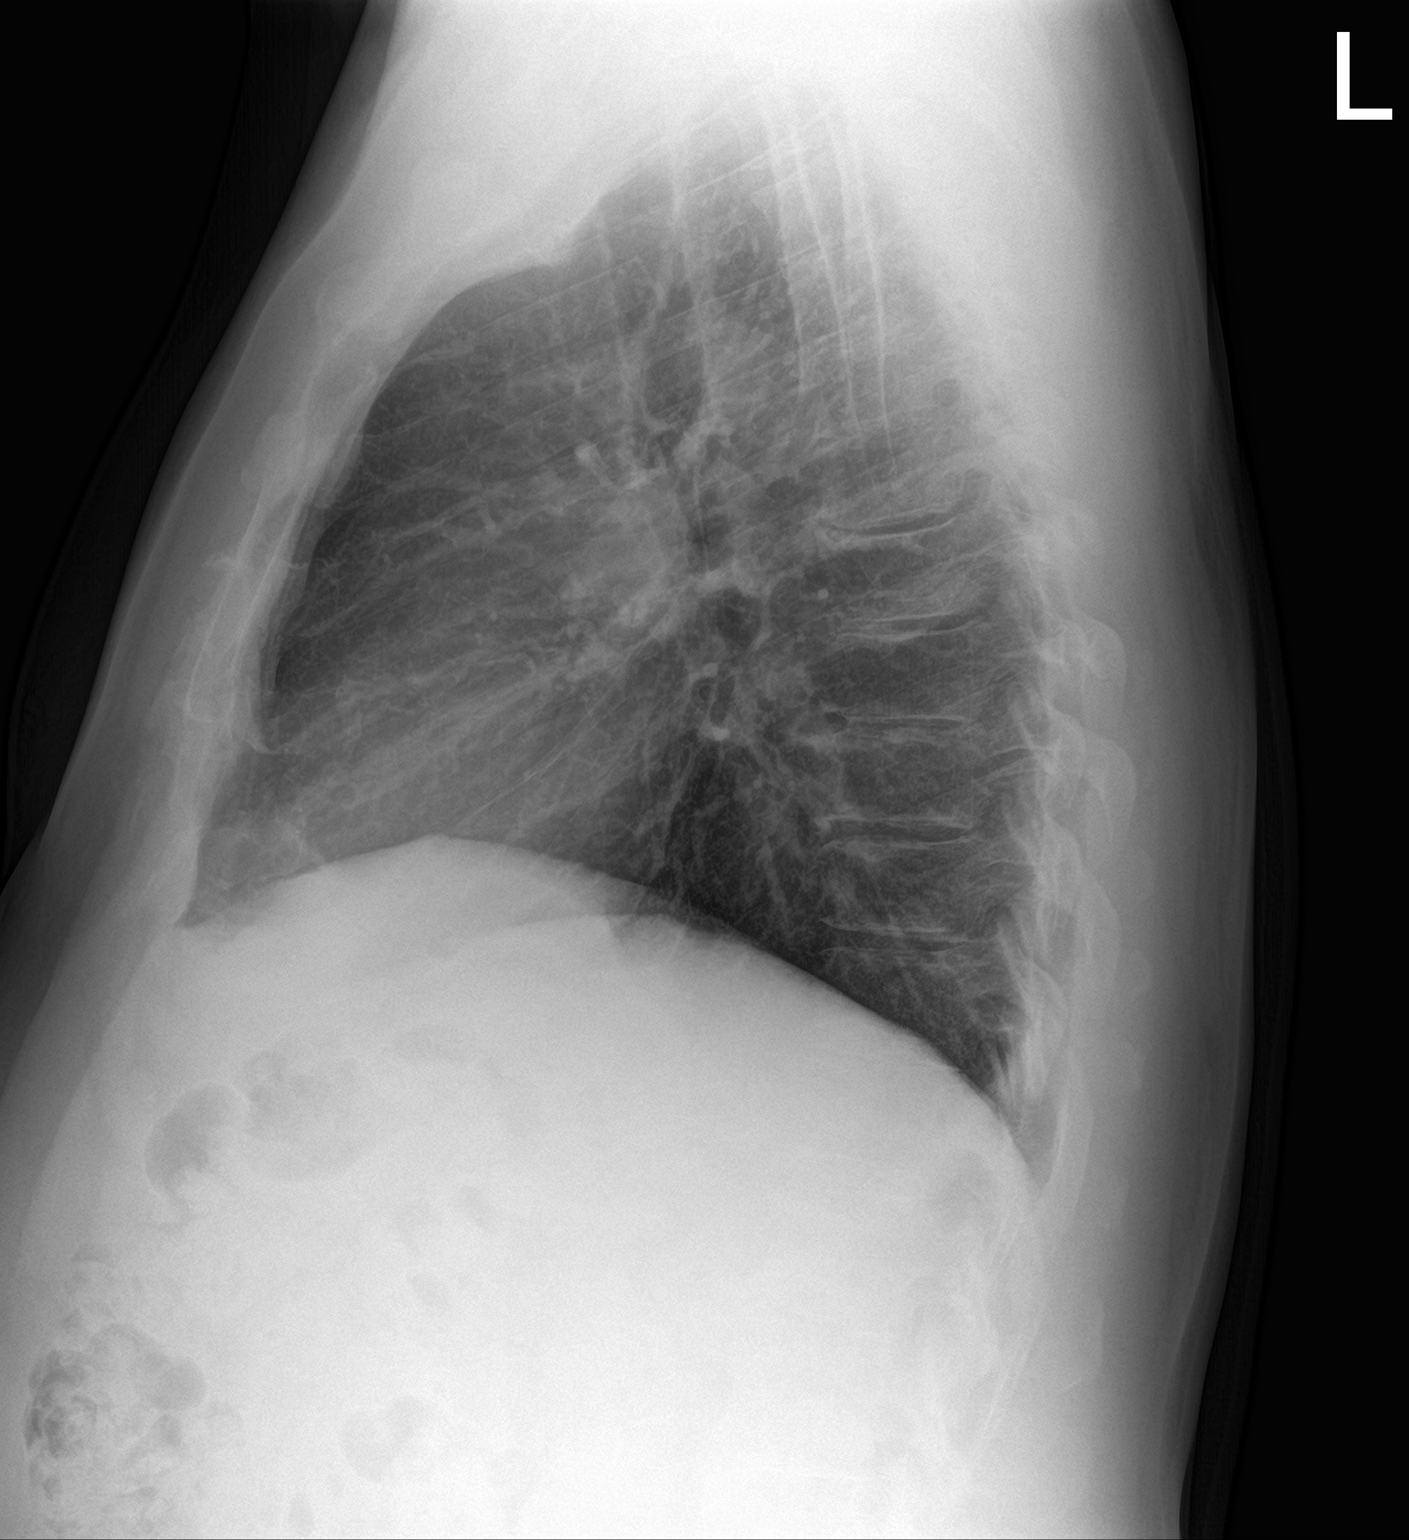

[2 of 2 positions shown; findings below may reference images not displayed]

FINDINGS: The heart size and mediastinal contours are within normal limits.
Both lungs are clear. Degenerative changes thoracic spine. Diffuse
thoracic spine osteopenia. Thoracic spine scoliosis.
IMPRESSION: No active cardiopulmonary disease.

## 2019-02-21 ENCOUNTER — Other Ambulatory Visit: Payer: Self-pay | Admitting: Cardiology

## 2019-02-23 NOTE — Telephone Encounter (Signed)
Allopurinol refill sent to [facility]

## 2019-07-05 NOTE — Progress Notes (Deleted)
Cardiology Office Note:    Date:  07/05/2019   ID:  Daniel Waters, DOB [birth year], MRN [id number]  PCP:  Patient, No Pcp Per  Cardiologist:  Shirlee More, MD    Referring MD: No ref. provider found    ASSESSMENT:    No diagnosis found. PLAN:    In order of problems listed above:  1. ***   Next appointment: ***   Medication Adjustments/Labs and Tests Ordered: Current medicines are reviewed at length with the patient today.  Concerns regarding medicines are outlined above.  No orders of the defined types were placed in this encounter.  No orders of the defined types were placed in this encounter.   No chief complaint on file.   History of Present Illness:    Daniel Waters is a 63 y.o. male with a hx of CAD multivessel and bypass surgery electively 07/12/2017    last seen 06/30/2018. Compliance with diet, lifestyle and medications: *** Past Medical History:  Diagnosis Date  . Anxiety   . Arthritis   . Benign essential HTN 05/20/2017  . Chest pain 05/20/2017  . Conductive hearing loss of both ears 05/01/2017  . Coronary artery disease   . History of gout 12/03/2016  . History of kidney stones   . Hypertriglyceridemia 04/17/2017  . Sleep apnea 05/20/2017   does not have per pt  . Vitamin D deficiency 05/20/2017    Past Surgical History:  Procedure Laterality Date  . CARPAL TUNNEL RELEASE    . COLONOSCOPY    . CORONARY ARTERY BYPASS GRAFT N/A 07/12/2017   Procedure: CORONARY ARTERY BYPASS GRAFTING (CABG)X3, USING LEFT INTERNAL MAMMARY ARTERY AND RIGHT GREATER SAPHENOUS VEIN ENDOSCOPIC VESSEL HARVESTING, TEE;  Surgeon: Gaye Pollack, MD;  Location: [place];  Service: Open Heart Surgery;  Laterality: N/A;  . INTRAVASCULAR PRESSURE WIRE/FFR STUDY N/A 06/24/2017   Procedure: INTRAVASCULAR PRESSURE WIRE/FFR STUDY;  Surgeon: Martinique, Peter M, MD;  Location: [place] CV LAB;  Service: Cardiovascular;  Laterality: N/A;  . knee Bilateral   . LEFT HEART CATH  AND CORONARY ANGIOGRAPHY N/A 06/24/2017   Procedure: LEFT HEART CATH AND CORONARY ANGIOGRAPHY;  Surgeon: Martinique, Peter M, MD;  Location: [place] CV LAB;  Service: Cardiovascular;  Laterality: N/A;  . TEE WITHOUT CARDIOVERSION N/A 07/12/2017   Procedure: TRANSESOPHAGEAL ECHOCARDIOGRAM (TEE);  Surgeon: Gaye Pollack, MD;  Location: [place];  Service: Open Heart Surgery;  Laterality: N/A;    Current Medications: No outpatient medications have been marked as taking for the 07/06/19 encounter (Appointment) with Richardo Priest, MD.     Allergies:   Sulfur   Social History   Socioeconomic History  . Marital status: Married    Spouse name: Not on file  . Number of children: Not on file  . Years of education: Not on file  . Highest education level: Not on file  Occupational History  . Not on file  Social Needs  . Financial resource strain: Not on file  . Food insecurity    Worry: Not on file    Inability: Not on file  . Transportation needs    Medical: Not on file    Non-medical: Not on file  Tobacco Use  . Smoking status: Former Smoker    Packs/day: 1.00    Years: 4.00    Pack years: 4.00    Types: Cigarettes  . Smokeless tobacco: Never Used  . Tobacco comment: SMOKED ONLY IN COLLEGE  Substance and Sexual Activity  .  Alcohol use: Yes    Comment: has not had any in 2 months beer and liquor at times  . Drug use: No  . Sexual activity: Not on file  Lifestyle  . Physical activity    Days per week: Not on file    Minutes per session: Not on file  . Stress: Not on file  Relationships  . Social Musician on phone: Not on file    Gets together: Not on file    Attends religious service: Not on file    Active member of club or organization: Not on file    Attends meetings of clubs or organizations: Not on file    Relationship status: Not on file  Other Topics Concern  . Not on file  Social History Narrative  . Not on file     Family History: The patient's  ***family history includes Cancer in his paternal grandmother; Heart attack (age of onset: 23) in his father; Stroke in his paternal grandfather. ROS:   Please see the history of present illness.    All other systems reviewed and are negative.  EKGs/Labs/Other Studies Reviewed:    The following studies were reviewed today:  EKG:  EKG ordered today and personally reviewed.  The ekg ordered today demonstrates ***  Recent Labs: No results found for requested labs within last 8760 hours.  Recent Lipid Panel    Component Value Date/Time   CHOL 128 06/30/2018 0922   TRIG 189 (H) 06/30/2018 0922   HDL 35 (L) 06/30/2018 0922   CHOLHDL 3.7 06/30/2018 0922   CHOLHDL 4 04/17/2017 0818   LDLCALC 55 06/30/2018 0922   LDLDIRECT 52.0 04/17/2017 0818    Physical Exam:    VS:  There were no vitals taken for this visit.    Wt Readings from Last 3 Encounters:  06/30/18 225 lb (102.1 kg)  10/01/17 219 lb 4 oz (99.5 kg)  09/19/17 219 lb (99.3 kg)     GEN: *** Well nourished, well developed in no acute distress HEENT: Normal NECK: No JVD; No carotid bruits LYMPHATICS: No lymphadenopathy CARDIAC: ***RRR, no murmurs, rubs, gallops RESPIRATORY:  Clear to auscultation without rales, wheezing or rhonchi  ABDOMEN: Soft, non-tender, non-distended MUSCULOSKELETAL:  No edema; No deformity  SKIN: Warm and dry NEUROLOGIC:  Alert and oriented x 3 PSYCHIATRIC:  Normal affect    Signed, Norman Herrlich, MD  07/05/2019 10:59 AM    [facility]

## 2019-07-06 ENCOUNTER — Ambulatory Visit: Payer: Self-pay | Admitting: Cardiology

## 2019-11-06 ENCOUNTER — Ambulatory Visit: Payer: Managed Care, Other (non HMO) | Admitting: Cardiology

## 2019-11-15 NOTE — Progress Notes (Signed)
Cardiology Office Note:    Date:  11/16/2019   ID:  Daniel Waters, DOB [birth year], MRN [id number]  PCP:  Patient, No Pcp Per  Cardiologist:  Norman Herrlich, MD    Referring MD: No ref. provider found    ASSESSMENT:    1. Coronary artery disease involving native coronary artery of native heart with angina pectoris (HCC)   2. Essential hypertension   3. Mixed hyperlipidemia    PLAN:    In order of problems listed above:  1. Continues to do well CAD is stable having no angina New York Heart Association class I continue aspirin reduced coated 81 mg daily beta-blocker and his high intensity statin.  He is overdue we will check labs today including CMP lipid profile blood pressure is at target and I strongly encouraged him to continue his healthy lifestyle with vigorous exercise and cardiac diet.   Next appointment: 1 year   Medication Adjustments/Labs and Tests Ordered: Current medicines are reviewed at length with the patient today.  Concerns regarding medicines are outlined above.  Orders Placed This Encounter  Procedures  . EKG 12-Lead   No orders of the defined types were placed in this encounter.   Chief Complaint  Patient presents with  . Follow-up    1 Year  . Coronary Artery Disease    History of Present Illness:    Daniel Waters is a 64 y.o. male with a hx of multivessel CAD with coronary artery bypass surgery in 2018 hypertension and hyperlipidemia last seen 06/30/2018. Compliance with diet, lifestyle and medications: Yes  He is doing well no angina dyspnea or syncope.  He exercises regularly every day sometimes if he uses upper extremities he is a bit store in the sternum but is not having angina.  Overdue for labs we will check in the office today EKG is reassuring minor nonspecific T waves similar October 2019 Past Medical History:  Diagnosis Date  . Anxiety   . Arthritis   . Benign essential HTN 05/20/2017  . Chest pain 05/20/2017  . Conductive  hearing loss of both ears 05/01/2017  . Coronary artery disease   . History of gout 12/03/2016  . History of kidney stones   . Hypertriglyceridemia 04/17/2017  . Sleep apnea 05/20/2017   does not have per pt  . Vitamin D deficiency 05/20/2017    Past Surgical History:  Procedure Laterality Date  . CARPAL TUNNEL RELEASE    . COLONOSCOPY    . CORONARY ARTERY BYPASS GRAFT N/A 07/12/2017   Procedure: CORONARY ARTERY BYPASS GRAFTING (CABG)X3, USING LEFT INTERNAL MAMMARY ARTERY AND RIGHT GREATER SAPHENOUS VEIN ENDOSCOPIC VESSEL HARVESTING, TEE;  Surgeon: [name], MD;  Location: [facility] OR;  Service: Open Heart Surgery;  Laterality: N/A;  . INTRAVASCULAR PRESSURE WIRE/FFR STUDY N/A 06/24/2017   Procedure: INTRAVASCULAR PRESSURE WIRE/FFR STUDY;  Surgeon: Swaziland, Peter M, MD;  Location: [facility] INVASIVE CV LAB;  Service: Cardiovascular;  Laterality: N/A;  . knee Bilateral   . LEFT HEART CATH AND CORONARY ANGIOGRAPHY N/A 06/24/2017   Procedure: LEFT HEART CATH AND CORONARY ANGIOGRAPHY;  Surgeon: Swaziland, Peter M, MD;  Location: [facility] INVASIVE CV LAB;  Service: Cardiovascular;  Laterality: N/A;  . TEE WITHOUT CARDIOVERSION N/A 07/12/2017   Procedure: TRANSESOPHAGEAL ECHOCARDIOGRAM (TEE);  Surgeon: [name], MD;  Location: [facility] OR;  Service: Open Heart Surgery;  Laterality: N/A;    Current Medications: Current Meds  Medication Sig  . allopurinol (ZYLOPRIM) 100 MG tablet Take 1 tablet by  mouth once daily  . aspirin EC 325 MG EC tablet Take 1 tablet (325 mg total) by mouth daily.  Marland Kitchen atorvastatin (LIPITOR) 40 MG tablet Take 1 tablet by mouth once daily  . metoprolol tartrate (LOPRESSOR) 25 MG tablet Take 1 tablet by mouth twice daily  . sildenafil (REVATIO) 20 MG tablet 1 PO prn     Allergies:   Sulfur   Social History   Socioeconomic History  . Marital status: Married    Spouse name: Not on file  . Number of children: Not on file  . Years of education: Not on file  . Highest education level:  Not on file  Occupational History  . Not on file  Tobacco Use  . Smoking status: Former Smoker    Packs/day: 1.00    Years: 4.00    Pack years: 4.00    Types: Cigarettes  . Smokeless tobacco: Never Used  . Tobacco comment: SMOKED ONLY IN COLLEGE  Substance and Sexual Activity  . Alcohol use: Yes    Comment: has not had any in 2 months beer and liquor at times  . Drug use: No  . Sexual activity: Not on file  Other Topics Concern  . Not on file  Social History Narrative  . Not on file   Social Determinants of Health   Financial Resource Strain:   . Difficulty of Paying Living Expenses:   Food Insecurity:   . Worried About Programme researcher, broadcasting/film/video in the Last Year:   . Barista in the Last Year:   Transportation Needs:   . Freight forwarder (Medical):   Marland Kitchen Lack of Transportation (Non-Medical):   Physical Activity:   . Days of Exercise per Week:   . Minutes of Exercise per Session:   Stress:   . Feeling of Stress :   Social Connections:   . Frequency of Communication with Friends and Family:   . Frequency of Social Gatherings with Friends and Family:   . Attends Religious Services:   . Active Member of Clubs or Organizations:   . Attends Banker Meetings:   Marland Kitchen Marital Status:      Family History: The patient's family history includes Cancer in his paternal grandmother; Heart attack (age of onset: 38) in his father; Stroke in his paternal grandfather. ROS:   Please see the history of present illness.    All other systems reviewed and are negative.  EKGs/Labs/Other Studies Reviewed:    The following studies were reviewed today:  EKG:  EKG ordered today and personally reviewed.  The ekg ordered today demonstrates sinus rhythm minor nonspecific T waves left atrial abnormality  Recent Labs: No results found for requested labs within last 8760 hours.  Recent Lipid Panel    Component Value Date/Time   CHOL 128 06/30/2018 0922   TRIG 189 (H)  06/30/2018 0922   HDL 35 (L) 06/30/2018 0922   CHOLHDL 3.7 06/30/2018 0922   CHOLHDL 4 04/17/2017 0818   LDLCALC 55 06/30/2018 0922   LDLDIRECT 52.0 04/17/2017 0818    Physical Exam:    VS:  BP 128/86   Pulse 77   Ht 6\' 1"  (1.854 m)   Wt 210 lb (95.3 kg)   SpO2 97%   BMI 27.71 kg/m     Wt Readings from Last 3 Encounters:  11/16/19 210 lb (95.3 kg)  06/30/18 225 lb (102.1 kg)  10/01/17 219 lb 4 oz (99.5 kg)     GEN:  Well nourished, well developed in no acute distress HEENT: Normal NECK: No JVD; No carotid bruits LYMPHATICS: No lymphadenopathy CARDIAC: RRR, no murmurs, rubs, gallops RESPIRATORY:  Clear to auscultation without rales, wheezing or rhonchi  ABDOMEN: Soft, non-tender, non-distended MUSCULOSKELETAL:  No edema; No deformity  SKIN: Warm and dry NEUROLOGIC:  Alert and oriented x 3 PSYCHIATRIC:  Normal affect    Signed, Shirlee More, MD  11/16/2019 4:10 PM    [facility]

## 2019-11-16 ENCOUNTER — Encounter: Payer: Self-pay | Admitting: Cardiology

## 2019-11-16 ENCOUNTER — Ambulatory Visit (INDEPENDENT_AMBULATORY_CARE_PROVIDER_SITE_OTHER): Payer: Self-pay | Admitting: Cardiology

## 2019-11-16 ENCOUNTER — Other Ambulatory Visit: Payer: Self-pay

## 2019-11-16 VITALS — BP 128/86 | HR 77 | Ht 73.0 in | Wt 210.0 lb

## 2019-11-16 DIAGNOSIS — I10 Essential (primary) hypertension: Secondary | ICD-10-CM

## 2019-11-16 DIAGNOSIS — E782 Mixed hyperlipidemia: Secondary | ICD-10-CM

## 2019-11-16 DIAGNOSIS — I25119 Atherosclerotic heart disease of native coronary artery with unspecified angina pectoris: Secondary | ICD-10-CM

## 2019-11-16 MED ORDER — ASPIRIN 81 MG PO TBEC: 81.0000 mg | DELAYED_RELEASE_TABLET | Freq: Every day | ORAL | 12 refills | Status: AC

## 2019-11-16 NOTE — Patient Instructions (Signed)
Medication Instructions:  Your physician has recommended you make the following change in your medication:  DECREASE: Aspirin 81 mg take one tablet by mouth daily. *If you need a refill on your cardiac medications before your next appointment, please call your pharmacy*   Lab Work: Your physician recommends that you return for lab work in: TODAY CMP, Lipids If you have labs (blood work) drawn today and your tests are completely normal, you will receive your results only by: Marland Kitchen MyChart Message (if you have MyChart) OR . A paper copy in the mail If you have any lab test that is abnormal or we need to change your treatment, we will call you to review the results.   Testing/Procedures: None   Follow-Up: At [facility], you and your health needs are our priority.  As part of our continuing mission to provide you with exceptional heart care, we have created designated Provider Care Teams.  These Care Teams include your primary Cardiologist (physician) and Advanced Practice Providers (APPs -  Physician Assistants and Nurse Practitioners) who all work together to provide you with the care you need, when you need it.  We recommend signing up for the patient portal called "MyChart".  Sign up information is provided on this After Visit Summary.  MyChart is used to connect with patients for Virtual Visits (Telemedicine).  Patients are able to view lab/test results, encounter notes, upcoming appointments, etc.  Non-urgent messages can be sent to your provider as well.   To learn more about what you can do with MyChart, go to [email or web address].    Your next appointment:   1 year(s)  The format for your next appointment:   In Person  Provider:   Norman Herrlich, MD   Other Instructions

## 2019-11-17 ENCOUNTER — Telehealth: Payer: Self-pay

## 2019-11-17 LAB — COMPREHENSIVE METABOLIC PANEL
ALT: 21 IU/L (ref 0–44)
AST: 22 IU/L (ref 0–40)
Albumin/Globulin Ratio: 2 (ref 1.2–2.2)
Albumin: 4.7 g/dL (ref 3.8–4.8)
Alkaline Phosphatase: 53 IU/L (ref 39–117)
BUN/Creatinine Ratio: 23 (ref 10–24)
BUN: 21 mg/dL (ref 8–27)
Bilirubin Total: 0.3 mg/dL (ref 0.0–1.2)
CO2: 24 mmol/L (ref 20–29)
Calcium: 9.9 mg/dL (ref 8.6–10.2)
Chloride: 105 mmol/L (ref 96–106)
Creatinine, Ser: 0.92 mg/dL (ref 0.76–1.27)
GFR calc Af Amer: 103 mL/min/{1.73_m2} (ref >59)
GFR calc non Af Amer: 89 mL/min/{1.73_m2} (ref >59)
Globulin, Total: 2.3 g/dL (ref 1.5–4.5)
Glucose: 89 mg/dL (ref 65–99)
Potassium: 4.4 mmol/L (ref 3.5–5.2)
Sodium: 142 mmol/L (ref 134–144)
Total Protein: 7 g/dL (ref 6.0–8.5)

## 2019-11-17 LAB — LIPID PANEL
Chol/HDL Ratio: 3.3 ratio (ref 0.0–5.0)
Cholesterol, Total: 127 mg/dL (ref 100–199)
HDL: 39 mg/dL — ABNORMAL LOW (ref >39)
LDL Chol Calc (NIH): 51 mg/dL (ref 0–99)
Triglycerides: 229 mg/dL — ABNORMAL HIGH (ref 0–149)
VLDL Cholesterol Cal: 37 mg/dL (ref 5–40)

## 2019-11-17 NOTE — Telephone Encounter (Signed)
-----   Message from Baldo Daub, MD sent at 11/17/2019  7:58 AM EDT ----- Normal or stable result  This was not fasting, good result no change in medications

## 2019-11-17 NOTE — Telephone Encounter (Signed)
Spoke with patients wife  regarding results.  She verbalizes understanding and is agreeable to plan of care. Advised patient/wife to call back with any issues or concerns.  

## 2019-12-10 ENCOUNTER — Other Ambulatory Visit: Payer: Self-pay | Admitting: Cardiology

## 2019-12-10 MED ORDER — ATORVASTATIN CALCIUM 40 MG PO TABS: 40.0000 mg | ORAL_TABLET | Freq: Every day | ORAL | 3 refills | Status: DC

## 2019-12-10 MED ORDER — METOPROLOL TARTRATE 25 MG PO TABS: 25.0000 mg | ORAL_TABLET | Freq: Two times a day (BID) | ORAL | 3 refills | Status: DC

## 2019-12-10 MED ORDER — ALLOPURINOL 100 MG PO TABS: 100.0000 mg | ORAL_TABLET | Freq: Every day | ORAL | 3 refills | Status: DC

## 2019-12-10 NOTE — Telephone Encounter (Signed)
Refills sent in per request. Dr. Dulce Sellar gave verbal ok to refill these medications.

## 2019-12-10 NOTE — Telephone Encounter (Signed)
*  STAT* If patient is at the pharmacy, call can be transferred to refill team.   1. Which medications need to be refilled? (please list name of each medication and dose if known)  allopurinol (ZYLOPRIM) 100 MG tablet atorvastatin (LIPITOR) 40 MG tablet metoprolol tartrate (LOPRESSOR) 25 MG tablet   2. Which pharmacy/location (including street and city if local pharmacy) is medication to be sent to? [facility] [address],  - [address]  3. Do they need a 30 day or 90 day supply? 90 day

## 2019-12-15 ENCOUNTER — Telehealth: Payer: Self-pay | Admitting: Cardiology

## 2019-12-15 NOTE — Telephone Encounter (Signed)
° °  Went to chart to check med list °

## 2020-07-27 ENCOUNTER — Telehealth: Payer: Self-pay | Admitting: Cardiology

## 2020-07-27 NOTE — Telephone Encounter (Signed)
Patient had surgery today and wife is calling in to get Dr. Hulen Shouts opinion about his EKG that was abnormal during the procedure. Please advise.

## 2020-07-27 NOTE — Telephone Encounter (Signed)
Called spoke to patient's wife she reports patient had a procedure today through [facility] system and wanted Dr. Dulce Sellar to review ekg as she was told during the procedure it was abnormal. I can not see ekg in [facility] everywhere. Patient wife will try to get a copy and she is going to call back and let me know.

## 2020-08-25 DIAGNOSIS — Z87442 Personal history of urinary calculi: Secondary | ICD-10-CM | POA: Insufficient documentation

## 2020-08-25 DIAGNOSIS — I2510 Atherosclerotic heart disease of native coronary artery without angina pectoris: Secondary | ICD-10-CM | POA: Insufficient documentation

## 2020-08-25 DIAGNOSIS — M1990 Unspecified osteoarthritis, unspecified site: Secondary | ICD-10-CM | POA: Insufficient documentation

## 2020-08-25 DIAGNOSIS — F419 Anxiety disorder, unspecified: Secondary | ICD-10-CM | POA: Insufficient documentation

## 2020-08-29 NOTE — Progress Notes (Unsigned)
Cardiology Office Note:    Date:  08/30/2020   ID:  Daniel Waters, DOB November 17, 1955, MRN [id number]  PCP:  Patient, No Pcp Per  Cardiologist:  Norman Herrlich, MD    Referring MD: No ref. provider found    ASSESSMENT:    1. Coronary artery disease involving native coronary artery of native heart with angina pectoris (HCC)   2. S/P CABG x 3   3. Essential hypertension   4. Mixed hyperlipidemia    PLAN:    In order of problems listed above:  1. Stable continue medical therapy I am concerned he is having CNS side effects from beta-blocker and do a 7-day event monitor and afterwards if he has no significant arrhythmia we will stop his beta-blocker. 2. BP at target, if we stop beta-blocker he may require alternate medication we will need to follow blood pressure closely at home 3. Continue statin we will check a lipid profile as well as a TSH today with his complaints of malaise and fatigue   Next appointment: 6 weeks in follow-up to the above   Medication Adjustments/Labs and Tests Ordered: Current medicines are reviewed at length with the patient today.  Concerns regarding medicines are outlined above.  Orders Placed This Encounter  Procedures  . Lipid panel  . TSH  . LONG TERM MONITOR (3-14 DAYS)   No orders of the defined types were placed in this encounter.   Chief Complaint  Patient presents with  . Follow-up  . Coronary Artery Disease      History of Present Illness:    Daniel Waters is a 65 y.o. male with a hx of CAD with CABG 2018 hypertension and hyperlipidemia last seen 11/16/2019.  Compliance with diet, lifestyle and medications: Yes  Insert multiple interventions for kidney stones to schedule tomorrow increase in EKG with him from yesterday sinus rhythm rate 60 normal. 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 No chest pain shortness of breath palpitation  or syncope He anticipates retirement in March He has trouble sleeping at night he will often wake with the feeling his heart is rapid and has forceful contraction.  Does not occur during the day His wife is present participates in the evaluation they share the bedroom and he does not snore.  Most recent labs are 08/29/2018 to [facility]: CMP with a creatinine of 0.98 potassium 4.3 GFR 82 cc/min normal renal function Liver function normal in December Past Medical History:  Diagnosis Date  . Anxiety   . Arthritis   . Benign essential HTN 05/20/2017  . Chest pain 05/20/2017  . Conductive hearing loss of both ears 05/01/2017  . Coronary artery disease   . Coronary artery disease involving native coronary artery of native heart with angina pectoris (HCC) 08/26/2017  . Dyslipidemia 04/17/2017  . History of gout 12/03/2016  . History of kidney stones   . Hyperlipidemia 09/19/2017  . Hypertriglyceridemia 04/17/2017  . Palpitation 05/22/2017  . Sleep apnea 05/20/2017   does not have per pt  . Vitamin D deficiency 05/20/2017    Past Surgical History:  Procedure Laterality Date  . CARPAL TUNNEL RELEASE    . COLONOSCOPY    . CORONARY ARTERY BYPASS GRAFT N/A 07/12/2017   Procedure: CORONARY ARTERY BYPASS GRAFTING (CABG)X3, USING LEFT INTERNAL MAMMARY ARTERY AND RIGHT GREATER SAPHENOUS VEIN ENDOSCOPIC VESSEL HARVESTING, TEE;  Surgeon: [name], MD;  Location: [facility] OR;  Service: Open Heart Surgery;  Laterality: N/A;  . INTRAVASCULAR PRESSURE WIRE/FFR STUDY N/A 06/24/2017   Procedure: INTRAVASCULAR PRESSURE WIRE/FFR STUDY;  Surgeon: Swaziland, Peter M, MD;  Location: [facility] INVASIVE CV LAB;  Service: Cardiovascular;  Laterality: N/A;  . knee Bilateral   . LEFT HEART CATH AND CORONARY ANGIOGRAPHY N/A 06/24/2017   Procedure: LEFT HEART CATH AND CORONARY ANGIOGRAPHY;  Surgeon: Swaziland, Peter M, MD;  Location: [facility] INVASIVE CV LAB;  Service: Cardiovascular;  Laterality: N/A;  . TEE WITHOUT CARDIOVERSION  N/A 07/12/2017   Procedure: TRANSESOPHAGEAL ECHOCARDIOGRAM (TEE);  Surgeon: [name], MD;  Location: [facility] OR;  Service: Open Heart Surgery;  Laterality: N/A;    Current Medications: Current Meds  Medication Sig  . allopurinol (ZYLOPRIM) 100 MG tablet Take 1 tablet (100 mg total) by mouth daily.  Marland Kitchen aspirin (ASPIRIN 81) 81 MG EC tablet Take 1 tablet (81 mg total) by mouth daily. Swallow whole.  Marland Kitchen atorvastatin (LIPITOR) 40 MG tablet Take 1 tablet (40 mg total) by mouth daily.  . metoprolol tartrate (LOPRESSOR) 25 MG tablet Take 1 tablet (25 mg total) by mouth 2 (two) times daily.  . naproxen (NAPROSYN) 500 MG tablet Take 500 mg by mouth 2 (two) times daily as needed.  . sildenafil (REVATIO) 20 MG tablet 1 PO prn  . tamsulosin (FLOMAX) 0.4 MG CAPS capsule Take 0.4 mg by mouth daily as needed.     Allergies:   Elemental sulfur   Social History   Socioeconomic History  . Marital status: Married    Spouse name: Not on file  . Number of children: Not on file  . Years of education: Not on file  . Highest education level: Not on file  Occupational History  . Not on file  Tobacco Use  . Smoking status: Former Smoker    Packs/day: 1.00    Years: 4.00    Pack years: 4.00    Types: Cigarettes  . Smokeless tobacco: Never Used  . Tobacco comment: SMOKED ONLY IN COLLEGE  Vaping Use  . Vaping Use: Never used  Substance and Sexual Activity  . Alcohol use: Yes    Comment: has not had any in 2 months beer and liquor at times  . Drug use: No  . Sexual activity: Not on file  Other Topics Concern  . Not on file  Social History Narrative  . Not on file   Social Determinants of Health   Financial Resource Strain: Not on file  Food Insecurity: Not on file  Transportation Needs: Not on file  Physical Activity: Not on file  Stress: Not on file  Social Connections: Not on file     Family History: The patient's family history includes Cancer in his paternal grandmother; Heart attack  (age of onset: 54) in his father; Stroke in his paternal grandfather. ROS:   Please see the history of present illness.    All other systems reviewed and are negative.  EKGs/Labs/Other Studies Reviewed:    The following studies were reviewed today:    Recent Labs: 11/16/2019: ALT 21; BUN 21; Creatinine, Ser 0.92; Potassium 4.4; Sodium 142  Recent Lipid Panel    Component Value Date/Time   CHOL 127 11/16/2019 1622   TRIG 229 (H) 11/16/2019 1622   HDL 39 (L) 11/16/2019 1622   CHOLHDL 3.3 11/16/2019 1622   CHOLHDL 4 04/17/2017 0818   LDLCALC 51 11/16/2019 1622   LDLDIRECT 52.0 04/17/2017 0818    Physical Exam:    VS:  BP  114/76   Pulse 68   Ht 6\' 1"  (1.854 m)   Wt 221 lb (100.2 kg)   SpO2 98%   BMI 29.16 kg/m     Wt Readings from Last 3 Encounters:  08/30/20 221 lb (100.2 kg)  11/16/19 210 lb (95.3 kg)  06/30/18 225 lb (102.1 kg)     GEN:  Well nourished, well developed in no acute distress HEENT: Normal NECK: No JVD; No carotid bruits LYMPHATICS: No lymphadenopathy CARDIAC: RRR, no murmurs, rubs, gallops RESPIRATORY:  Clear to auscultation without rales, wheezing or rhonchi  ABDOMEN: Soft, non-tender, non-distended MUSCULOSKELETAL:  No edema; No deformity  SKIN: Warm and dry NEUROLOGIC:  Alert and oriented x 3 PSYCHIATRIC:  Normal affect    Signed, 07/02/18, MD  08/30/2020 8:50 AM    [facility]

## 2020-08-30 ENCOUNTER — Encounter: Payer: Self-pay | Admitting: Cardiology

## 2020-08-30 ENCOUNTER — Ambulatory Visit (INDEPENDENT_AMBULATORY_CARE_PROVIDER_SITE_OTHER): Payer: PRIVATE HEALTH INSURANCE | Admitting: Cardiology

## 2020-08-30 ENCOUNTER — Other Ambulatory Visit: Payer: Self-pay

## 2020-08-30 ENCOUNTER — Ambulatory Visit (INDEPENDENT_AMBULATORY_CARE_PROVIDER_SITE_OTHER): Payer: PRIVATE HEALTH INSURANCE

## 2020-08-30 VITALS — BP 114/76 | HR 68 | Ht 73.0 in | Wt 221.0 lb

## 2020-08-30 DIAGNOSIS — I25119 Atherosclerotic heart disease of native coronary artery with unspecified angina pectoris: Secondary | ICD-10-CM

## 2020-08-30 DIAGNOSIS — Z951 Presence of aortocoronary bypass graft: Secondary | ICD-10-CM | POA: Diagnosis not present

## 2020-08-30 DIAGNOSIS — R5383 Other fatigue: Secondary | ICD-10-CM

## 2020-08-30 DIAGNOSIS — I10 Essential (primary) hypertension: Secondary | ICD-10-CM

## 2020-08-30 DIAGNOSIS — E782 Mixed hyperlipidemia: Secondary | ICD-10-CM

## 2020-08-30 DIAGNOSIS — R5381 Other malaise: Secondary | ICD-10-CM

## 2020-08-30 NOTE — Patient Instructions (Signed)
Medication Instructions:  Your physician recommends that you continue on your current medications as directed. Please refer to the Current Medication list given to you today.  *If you need a refill on your cardiac medications before your next appointment, please call your pharmacy*   Lab Work: Your physician recommends that you return for lab work in: TODAY Lipids, TSH If you have labs (blood work) drawn today and your tests are completely normal, you will receive your results only by: Marland Kitchen MyChart Message (if you have MyChart) OR . A paper copy in the mail If you have any lab test that is abnormal or we need to change your treatment, we will call you to review the results.   Testing/Procedures: A zio monitor was ordered today. It will remain on for 7 days. You will then return monitor and event diary in provided box. It takes 1-2 weeks for report to be downloaded and returned to [place]. We will call you with the results. If monitor falls off or has orange flashing light, please call Zio for further instructions.      Follow-Up: At [facility], you and your health needs are our priority.  As part of our continuing mission to provide you with exceptional heart care, we have created designated Provider Care Teams.  These Care Teams include your primary Cardiologist (physician) and Advanced Practice Providers (APPs -  Physician Assistants and Nurse Practitioners) who all work together to provide you with the care you need, when you need it.  We recommend signing up for the patient portal called "MyChart".  Sign up information is provided on this After Visit Summary.  MyChart is used to connect with patients for Virtual Visits (Telemedicine).  Patients are able to view lab/test results, encounter notes, upcoming appointments, etc.  Non-urgent messages can be sent to your provider as well.   To learn more about what you can do with MyChart, go to [email or web address].    Your next appointment:    6 week(s)  The format for your next appointment:   In Person  Provider:   Norman Herrlich, MD   Other Instructions

## 2020-08-31 ENCOUNTER — Telehealth: Payer: Self-pay

## 2020-08-31 LAB — LIPID PANEL
Chol/HDL Ratio: 3.1 ratio (ref 0.0–5.0)
Cholesterol, Total: 115 mg/dL (ref 100–199)
HDL: 37 mg/dL — ABNORMAL LOW (ref >39)
LDL Chol Calc (NIH): 55 mg/dL (ref 0–99)
Triglycerides: 132 mg/dL (ref 0–149)
VLDL Cholesterol Cal: 23 mg/dL (ref 5–40)

## 2020-08-31 LAB — TSH: TSH: 1.8 u[IU]/mL (ref 0.450–4.500)

## 2020-08-31 NOTE — Telephone Encounter (Signed)
Spoke with patient regarding results and recommendation.  Patient verbalizes understanding and is agreeable to plan of care. Advised patient to call back with any issues or concerns.  

## 2020-08-31 NOTE — Telephone Encounter (Signed)
-----   Message from Baldo Daub, MD sent at 08/31/2020  8:12 AM EST ----- Lipids are very good no change in treatment  His thyroid is normal  Remind him to ask [place] whether he should stay on metoprolol after his see his event monitor that he is going to apply after his urologic surgery.

## 2020-09-21 ENCOUNTER — Telehealth: Payer: Self-pay

## 2020-09-21 NOTE — Telephone Encounter (Signed)
Spoke to pt to give him his results per MD. He verbalized understanding and has no further questions/concerns at this time.

## 2020-10-17 NOTE — Progress Notes (Deleted)
Cardiology Office Note:    Date:  10/17/2020   ID:  Daniel Waters, DOB 1955/09/02, MRN [id number]  PCP:  Patient, No Pcp Per  Cardiologist:  Norman Herrlich, MD    Referring MD: No ref. provider found    ASSESSMENT:    No diagnosis found. PLAN:    In order of problems listed above:  1. ***   Next appointment: ***   Medication Adjustments/Labs and Tests Ordered: Current medicines are reviewed at length with the patient today.  Concerns regarding medicines are outlined above.  No orders of the defined types were placed in this encounter.  No orders of the defined types were placed in this encounter.   No chief complaint on file.   History of Present Illness:    Daniel Waters is a 65 y.o. male with a hx of CAD with CABG 2018 hypertension and hyperlipidemia last seen 08/30/2020.  At his last visit he had symptoms raising concern of malaise due to beta-blocker as well as complaints of palpitation.    Compliance with diet, lifestyle and medications: ***  He has visit he underwent a 7-day event monitor reported 09/21/2020 showing rare ventricular and supraventricular ectopy and is symptomatic and triggered events were predominantly unassociated with arrhythmia. Past Medical History:  Diagnosis Date  . Anxiety   . Arthritis   . Benign essential HTN 05/20/2017  . Chest pain 05/20/2017  . Conductive hearing loss of both ears 05/01/2017  . Coronary artery disease   . Coronary artery disease involving native coronary artery of native heart with angina pectoris (HCC) 08/26/2017  . Dyslipidemia 04/17/2017  . History of gout 12/03/2016  . History of kidney stones   . Hyperlipidemia 09/19/2017  . Hypertriglyceridemia 04/17/2017  . Palpitation 05/22/2017  . Sleep apnea 05/20/2017   does not have per pt  . Vitamin D deficiency 05/20/2017    Past Surgical History:  Procedure Laterality Date  . CARPAL TUNNEL RELEASE    . COLONOSCOPY    . CORONARY ARTERY BYPASS GRAFT N/A  07/12/2017   Procedure: CORONARY ARTERY BYPASS GRAFTING (CABG)X3, USING LEFT INTERNAL MAMMARY ARTERY AND RIGHT GREATER SAPHENOUS VEIN ENDOSCOPIC VESSEL HARVESTING, TEE;  Surgeon: [name], MD;  Location: [facility] OR;  Service: Open Heart Surgery;  Laterality: N/A;  . INTRAVASCULAR PRESSURE WIRE/FFR STUDY N/A 06/24/2017   Procedure: INTRAVASCULAR PRESSURE WIRE/FFR STUDY;  Surgeon: Swaziland, Peter M, MD;  Location: [facility] INVASIVE CV LAB;  Service: Cardiovascular;  Laterality: N/A;  . knee Bilateral   . LEFT HEART CATH AND CORONARY ANGIOGRAPHY N/A 06/24/2017   Procedure: LEFT HEART CATH AND CORONARY ANGIOGRAPHY;  Surgeon: Swaziland, Peter M, MD;  Location: [facility] INVASIVE CV LAB;  Service: Cardiovascular;  Laterality: N/A;  . TEE WITHOUT CARDIOVERSION N/A 07/12/2017   Procedure: TRANSESOPHAGEAL ECHOCARDIOGRAM (TEE);  Surgeon: [name], MD;  Location: [facility] OR;  Service: Open Heart Surgery;  Laterality: N/A;    Current Medications: No outpatient medications have been marked as taking for the 10/18/20 encounter (Appointment) with Baldo Daub, MD.     Allergies:   Elemental sulfur   Social History   Socioeconomic History  . Marital status: Married    Spouse name: Not on file  . Number of children: Not on file  . Years of education: Not on file  . Highest education level: Not on file  Occupational History  . Not on file  Tobacco Use  . Smoking status: Former Smoker    Packs/day: 1.00    Years:  4.00    Pack years: 4.00    Types: Cigarettes  . Smokeless tobacco: Never Used  . Tobacco comment: SMOKED ONLY IN COLLEGE  Vaping Use  . Vaping Use: Never used  Substance and Sexual Activity  . Alcohol use: Yes    Comment: has not had any in 2 months beer and liquor at times  . Drug use: No  . Sexual activity: Not on file  Other Topics Concern  . Not on file  Social History Narrative  . Not on file   Social Determinants of Health   Financial Resource Strain: Not on file  Food Insecurity:  Not on file  Transportation Needs: Not on file  Physical Activity: Not on file  Stress: Not on file  Social Connections: Not on file     Family History: The patient's ***family history includes Cancer in his paternal grandmother; Heart attack (age of onset: 25) in his father; Stroke in his paternal grandfather. ROS:   Please see the history of present illness.    All other systems reviewed and are negative.  EKGs/Labs/Other Studies Reviewed:    The following studies were reviewed today:  EKG:  EKG ordered today and personally reviewed.  The ekg ordered today demonstrates ***  Recent Labs: 11/16/2019: ALT 21; BUN 21; Creatinine, Ser 0.92; Potassium 4.4; Sodium 142 08/30/2020: TSH 1.800  Recent Lipid Panel    Component Value Date/Time   CHOL 115 08/30/2020 0930   TRIG 132 08/30/2020 0930   HDL 37 (L) 08/30/2020 0930   CHOLHDL 3.1 08/30/2020 0930   CHOLHDL 4 04/17/2017 0818   LDLCALC 55 08/30/2020 0930   LDLDIRECT 52.0 04/17/2017 0818    Physical Exam:    VS:  There were no vitals taken for this visit.    Wt Readings from Last 3 Encounters:  08/30/20 221 lb (100.2 kg)  11/16/19 210 lb (95.3 kg)  06/30/18 225 lb (102.1 kg)     GEN: *** Well nourished, well developed in no acute distress HEENT: Normal NECK: No JVD; No carotid bruits LYMPHATICS: No lymphadenopathy CARDIAC: ***RRR, no murmurs, rubs, gallops RESPIRATORY:  Clear to auscultation without rales, wheezing or rhonchi  ABDOMEN: Soft, non-tender, non-distended MUSCULOSKELETAL:  No edema; No deformity  SKIN: Warm and dry NEUROLOGIC:  Alert and oriented x 3 PSYCHIATRIC:  Normal affect    Signed, Norman Herrlich, MD  10/17/2020 10:58 AM    [facility]

## 2020-10-18 ENCOUNTER — Ambulatory Visit: Payer: PRIVATE HEALTH INSURANCE | Admitting: Cardiology

## 2021-01-18 ENCOUNTER — Telehealth: Payer: Self-pay | Admitting: Cardiology

## 2021-01-18 MED ORDER — ALLOPURINOL 100 MG PO TABS: 100.0000 mg | ORAL_TABLET | Freq: Every day | ORAL | 3 refills | Status: AC

## 2021-01-18 MED ORDER — METOPROLOL TARTRATE 25 MG PO TABS: 25.0000 mg | ORAL_TABLET | Freq: Two times a day (BID) | ORAL | 3 refills | Status: DC

## 2021-01-18 MED ORDER — SILDENAFIL CITRATE 20 MG PO TABS: ORAL_TABLET | ORAL | 3 refills | Status: AC

## 2021-01-18 MED ORDER — ATORVASTATIN CALCIUM 40 MG PO TABS: 40.0000 mg | ORAL_TABLET | Freq: Every day | ORAL | 3 refills | Status: AC

## 2021-01-18 NOTE — Telephone Encounter (Signed)
*  STAT* If patient is at the pharmacy, call can be transferred to refill team.   1. Which medications need to be refilled? (please list name of each medication and dose if known) metoprolol, allopurinol, sildenafil, atorvastatin   2. Which pharmacy/location (including street and city if local pharmacy) is medication to be sent to? [facility]   3. Do they need a 30 day or 90 day supply? 90

## 2021-01-18 NOTE — Telephone Encounter (Signed)
Refill sent in per request.  

## 2021-08-16 ENCOUNTER — Telehealth: Payer: Self-pay | Admitting: Cardiology

## 2021-08-16 DIAGNOSIS — I25119 Atherosclerotic heart disease of native coronary artery with unspecified angina pectoris: Secondary | ICD-10-CM

## 2021-08-16 NOTE — Telephone Encounter (Signed)
Patient states he has been living in [facility], [place] and he is requesting a referral to a cardiologist in that area as his insurance is no longer in network. Please assist.

## 2021-08-16 NOTE — Telephone Encounter (Signed)
Recommendations reviewed with pt as per Dr. Munley's note.  Pt verbalized understanding and had no additional questions.  

## 2021-08-16 NOTE — Telephone Encounter (Signed)
Spoke with pt and his wife and they would like to know if you have any recommendations for cardiologist in [facility].

## 2021-08-25 NOTE — Telephone Encounter (Signed)
Patient's wife is following up. She states the patient plans to follow up with [facility] in [place], [place]. They do require a referral with OV notes + dx). Please assist as able.  Phone #: [phone] Fax#: [phone]

## 2021-08-25 NOTE — Addendum Note (Signed)
Addended by: Delorse Limber I on: 08/25/2021 02:01 PM   Modules accepted: Orders

## 2021-08-25 NOTE — Telephone Encounter (Signed)
Referral Fax sent at this time.

## 2021-10-26 ENCOUNTER — Other Ambulatory Visit: Payer: Self-pay | Admitting: Cardiology
